# Patient Record
Sex: Female | Born: 1985 | Race: White | Hispanic: No | Marital: Single | Smoking: Never smoker
Health system: Southern US, Academic
[De-identification: ages and names within clinical notes are randomized; demographics above are authoritative.]

## PROBLEM LIST (undated history)

## (undated) DIAGNOSIS — N809 Endometriosis, unspecified: Secondary | ICD-10-CM

## (undated) DIAGNOSIS — M8580 Other specified disorders of bone density and structure, unspecified site: Secondary | ICD-10-CM

## (undated) DIAGNOSIS — J302 Other seasonal allergic rhinitis: Secondary | ICD-10-CM

## (undated) DIAGNOSIS — J309 Allergic rhinitis, unspecified: Secondary | ICD-10-CM

## (undated) DIAGNOSIS — F0781 Postconcussional syndrome: Secondary | ICD-10-CM

## (undated) HISTORY — DX: Postconcussional syndrome: F07.81

## (undated) HISTORY — DX: Allergic rhinitis, unspecified: J30.9

## (undated) HISTORY — PX: HX CYST REMOVAL: SHX22

---
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
## 2005-05-02 ENCOUNTER — Ambulatory Visit (INDEPENDENT_AMBULATORY_CARE_PROVIDER_SITE_OTHER): Payer: Self-pay

## 2005-05-03 ENCOUNTER — Ambulatory Visit (INDEPENDENT_AMBULATORY_CARE_PROVIDER_SITE_OTHER): Payer: Self-pay | Admitting: Multi-Specialty

## 2005-05-23 ENCOUNTER — Ambulatory Visit (INDEPENDENT_AMBULATORY_CARE_PROVIDER_SITE_OTHER): Payer: Self-pay

## 2005-08-05 ENCOUNTER — Emergency Department (HOSPITAL_COMMUNITY): Payer: Self-pay | Admitting: EMERGENCY MEDICINE

## 2006-03-12 ENCOUNTER — Emergency Department (HOSPITAL_COMMUNITY): Payer: Auto Insurance (includes no fault)

## 2006-03-12 ENCOUNTER — Emergency Department (EMERGENCY_DEPARTMENT_HOSPITAL): Payer: Auto Insurance (includes no fault) | Admitting: Radiology

## 2006-03-12 ENCOUNTER — Emergency Department
Admission: EM | Admit: 2006-03-12 | Discharge: 2006-03-12 | Disposition: A | Discharge status: Home / Self Care | Payer: Auto Insurance (includes no fault) | Source: Emergency Department | Attending: Emergency Medicine | Admitting: Emergency Medicine

## 2006-03-12 ENCOUNTER — Emergency Department (EMERGENCY_DEPARTMENT_HOSPITAL): Payer: Auto Insurance (includes no fault)

## 2006-03-12 ENCOUNTER — Encounter (EMERGENCY_DEPARTMENT_HOSPITAL): Payer: Self-pay

## 2006-03-12 DIAGNOSIS — M25569 Pain in unspecified knee: Secondary | ICD-10-CM | POA: Insufficient documentation

## 2006-03-12 DIAGNOSIS — IMO0002 Reserved for concepts with insufficient information to code with codable children: Secondary | ICD-10-CM | POA: Insufficient documentation

## 2006-03-12 DIAGNOSIS — S139XXA Sprain of joints and ligaments of unspecified parts of neck, initial encounter: Secondary | ICD-10-CM | POA: Insufficient documentation

## 2006-03-12 DIAGNOSIS — S060X1A Concussion with loss of consciousness of 30 minutes or less, initial encounter: Secondary | ICD-10-CM | POA: Insufficient documentation
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
## 2006-08-07 ENCOUNTER — Ambulatory Visit (INDEPENDENT_AMBULATORY_CARE_PROVIDER_SITE_OTHER): Payer: No Typology Code available for payment source

## 2006-08-27 ENCOUNTER — Ambulatory Visit (INDEPENDENT_AMBULATORY_CARE_PROVIDER_SITE_OTHER): Payer: No Typology Code available for payment source | Admitting: Specialist

## 2006-09-13 ENCOUNTER — Ambulatory Visit (INDEPENDENT_AMBULATORY_CARE_PROVIDER_SITE_OTHER): Payer: Auto Insurance (includes no fault) | Admitting: Specialist

## 2006-11-13 ENCOUNTER — Encounter (INDEPENDENT_AMBULATORY_CARE_PROVIDER_SITE_OTHER): Payer: Auto Insurance (includes no fault) | Admitting: Specialist

## 2006-12-11 ENCOUNTER — Encounter (INDEPENDENT_AMBULATORY_CARE_PROVIDER_SITE_OTHER): Payer: Auto Insurance (includes no fault) | Admitting: Reproductive Endocrinology

## 2007-01-24 ENCOUNTER — Encounter (INDEPENDENT_AMBULATORY_CARE_PROVIDER_SITE_OTHER): Payer: No Typology Code available for payment source | Admitting: Reproductive Endocrinology

## 2007-02-16 ENCOUNTER — Ambulatory Visit (INDEPENDENT_AMBULATORY_CARE_PROVIDER_SITE_OTHER): Payer: No Typology Code available for payment source | Admitting: Reproductive Endocrinology
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
## 2008-02-06 ENCOUNTER — Ambulatory Visit (INDEPENDENT_AMBULATORY_CARE_PROVIDER_SITE_OTHER): Payer: Self-pay | Admitting: Specialist

## 2008-02-06 MED ORDER — RIZATRIPTAN 10 MG DISINTEGRATING TABLET
10.00 mg | ORAL_TABLET | Freq: Every day | ORAL | Status: DC | PRN
Start: 2008-02-06 — End: 2008-12-18

## 2008-02-06 MED ORDER — TOPIRAMATE 25 MG TABLET
25.00 mg | ORAL_TABLET | ORAL | Status: DC
Start: 2008-02-06 — End: 2008-12-18

## 2008-02-06 MED ORDER — TOPIRAMATE 100 MG TABLET
100.00 mg | ORAL_TABLET | Freq: Every evening | ORAL | Status: DC
Start: 2008-02-06 — End: 2008-10-01

## 2008-06-18 ENCOUNTER — Ambulatory Visit (INDEPENDENT_AMBULATORY_CARE_PROVIDER_SITE_OTHER): Payer: Self-pay | Admitting: Specialist

## 2008-06-18 MED ORDER — PROPRANOLOL 20 MG TABLET
ORAL_TABLET | ORAL | Status: DC
Start: 2008-06-18 — End: 2008-10-01

## 2008-10-01 ENCOUNTER — Other Ambulatory Visit (INDEPENDENT_AMBULATORY_CARE_PROVIDER_SITE_OTHER): Payer: Self-pay | Admitting: Specialist

## 2008-10-01 MED ORDER — TOPIRAMATE 100 MG TABLET
100.00 mg | ORAL_TABLET | Freq: Every evening | ORAL | Status: DC
Start: 2008-10-01 — End: 2008-12-18

## 2008-10-01 MED ORDER — PROPRANOLOL 20 MG TABLET
ORAL_TABLET | ORAL | Status: DC
Start: 2008-10-01 — End: 2008-12-18

## 2008-10-01 NOTE — Telephone Encounter (Signed)
Called in  done

## 2008-12-18 ENCOUNTER — Ambulatory Visit (INDEPENDENT_AMBULATORY_CARE_PROVIDER_SITE_OTHER): Admitting: Specialist

## 2008-12-18 MED ORDER — TOPIRAMATE 100 MG TABLET
ORAL_TABLET | ORAL | Status: DC
Start: 2008-12-18 — End: 2010-01-11

## 2008-12-18 MED ORDER — MAGNESIUM OXIDE 400 MG (241.3 MG MAGNESIUM) TABLET
400.00 mg | ORAL_TABLET | Freq: Two times a day (BID) | ORAL | Status: DC
Start: 2008-12-18 — End: 2009-06-21

## 2008-12-18 MED ORDER — ELETRIPTAN 40 MG TABLET
ORAL_TABLET | ORAL | Status: DC
Start: 2008-12-18 — End: 2009-12-20

## 2008-12-18 NOTE — Progress Notes (Signed)
RE:  Alison Harvey          1985/05/20    Dear Noralee Space, PA-C:    I had the pleasure of seeing Alison Harvey in follow-up at the [facility] on 12/18/2008.  Today she reports that her headaches have not changed.  She is currently taking propanolol (only once per day due to hypotension) and topiramate for prevention and using rizatriptan as needed for her headaches.  Severe Headaches: 5 per month  Total Headaches: 7 per week at 2-3/10 in severity  Headache Free Days: 0 per week  Headache Description: throbbing pain, dull pain, bilateral in the frontal area.  Rated as 8-9/10.  Response to abortive medications: Fair - maxalt seems to be less effective, needs 2 pills, inconsistent effect  Side effects to medications: hypotension with maxalt    Results of previous testing: n/a    Current outpatient prescriptions   Medication Sig Dispense Refill   . propranolol (INDERAL) 20 mg Tab take by mouth. 1 qday       . oral contraceptive (PATIENT'S OWN SUPPLY) take by mouth.        . topiramate (TOPAMAX) 100 mg Tab take by mouth. 1 bid   60  11   . eletriptan (RELPAX) 40 mg Tab take by mouth. Take at onset of headache, may repeat in 2 hours if needed.  Max 2/d, max 3d/wk   9  11   . magnesium oxide (MAG-OX) 400 mg Tab take 1 Tab by mouth Twice daily.   0  0   . DISCONTD: propranolol (INDERAL) 20 mg Tab take by mouth. 1 qam x 7d, then 1 bid   180  3   . DISCONTD: topiramate (TOPAMAX) 100 mg Tab take 1 Tab by mouth every night.   90  3   . DISCONTD: topiramate (TOPAMAX) 25 mg Tab take 1 Tab by mouth See Comment. 1 qhs x 7d, 2 qhs x 7d, 3 qhs x 7d   42  0   . DISCONTD: Rizatriptan (MAXALT-MLT) 10 mg TbDL take 1 Tab by mouth Once per day as needed for Migraine. may repeat in 2 hours if needed. Maximum 2/d, maximum 3d/wk   12  11          Past medical history, family history, and social history have been reviewed and confirmed.    Sleep:on night shift, sleep poor  Mood:Euthymic    Filed Vitals:    12/18/2008 11:20 AM    BP: 110/78   Pulse: 72   Height: 1.626 m (5\' 4" )   Weight: 49.442 kg (109 lb)       Her exam reveals she is normocephalic and atraumatic.  Mental status exam shows her to be oriented with good memory, attention, knowledge, and language.  Cranial nerve exam reveals equal and reactive pupils with intact conjugate eye movements, symmetric face, and clear speech.  Gait is steady and coordination is intact to finger-to-nose testing.    Encounter Diagnoses   Code Name Primary? Qualifier   . 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 784.0EX Chronic daily headache      Plan: TOPIRAMATE 100 MG TAB, MAGNESIUM OXIDE 400 MG TAB    She is going to increase topamax to 100mg  bid and add magnesium for prevention.  Due to side effects with imitrex and lack of response to  maxalt, she is going to go onto relpax as needed.    The patient will follow up with me in 6 months or sooner if needed.    Oley Balm Claudette Laws MD  Director, [facility]  Assistant Professor of Neurology  [facility] [employer]

## 2009-04-26 ENCOUNTER — Telehealth (INDEPENDENT_AMBULATORY_CARE_PROVIDER_SITE_OTHER): Payer: Self-pay | Admitting: Specialist

## 2009-04-26 NOTE — Telephone Encounter (Signed)
Worse headaches may be effect of poor sleep.  Take melatonin - over the counter - 3mg  up to 6mg  1 hour before bed.

## 2009-04-26 NOTE — Telephone Encounter (Signed)
Dr. Claudette Laws pt//////////////pt states that she can not sleep well and wants to know if she can take something to sleep.pt is still having headaches and they are worse than they were. Please call and advose. Thank you

## 2009-04-27 ENCOUNTER — Encounter (INDEPENDENT_AMBULATORY_CARE_PROVIDER_SITE_OTHER): Payer: Self-pay | Admitting: Specialist

## 2009-04-27 NOTE — Progress Notes (Signed)
Left mess on voice mail of Dr Aura Dials response

## 2009-06-21 ENCOUNTER — Encounter (INDEPENDENT_AMBULATORY_CARE_PROVIDER_SITE_OTHER): Payer: Self-pay | Admitting: Specialist

## 2009-06-21 ENCOUNTER — Ambulatory Visit (INDEPENDENT_AMBULATORY_CARE_PROVIDER_SITE_OTHER): Payer: PPO | Admitting: Specialist

## 2009-06-21 MED ORDER — PROPRANOLOL 20 MG TABLET
ORAL_TABLET | ORAL | Status: DC
Start: 2009-06-21 — End: 2009-12-20

## 2009-06-21 MED ORDER — MAGNESIUM OXIDE 400 MG (241.3 MG MAGNESIUM) TABLET
ORAL_TABLET | ORAL | Status: DC
Start: 2009-06-21 — End: 2009-12-20

## 2009-06-21 NOTE — Progress Notes (Signed)
RE:  Alison Harvey          Jan 04, 1986    Dear Noralee Space, PA-C:    I had the pleasure of seeing Alison Harvey in follow-up at the [facility] on 06/21/2009.  Today she reports that her headaches have not changed.  She is currently taking topiramate for prevention and using rizatriptan as needed for her headaches.  Severe Headaches: 6-8 per month  Total Headaches: 7 per week  Headache Free Days: 0 per week  Headache Description: throbbing pain, dull pain, bilateral in the frontal area.  Rated as 2/10 at baseline and up to 9/10 for severe headaches.  It can last hours to all day.  Response to abortive medications: Fair - reduces severe headaches to a tolerable level.  Side effects to medications: none    Results of previous testing: n/a    Current outpatient prescriptions   Medication Sig Dispense Refill   . propranolol (INDERAL) 20 mg Tab take  by mouth. 1 daily  30 Tab  11   . magnesium oxide (MAG-OX) 400 mg Tab take  by mouth. bid  60 Tab  0   . oral contraceptive (PATIENT'S OWN SUPPLY) take by mouth.        . topiramate (TOPAMAX) 100 mg Tab take by mouth. 1 bid   60  11   . eletriptan (RELPAX) 40 mg Tab take by mouth. Take at onset of headache, may repeat in 2 hours if needed.  Max 2/d, max 3d/wk   9  11   . DISCONTD: propranolol (INDERAL) 20 mg Tab take by mouth. 1 qday       . DISCONTD: magnesium oxide (MAG-OX) 400 mg Tab take 1 Tab by mouth Twice daily.   0  0        Past medical history, family history, and social history have been reviewed and confirmed.    Sleep:ok  Mood:Euthymic    Filed Vitals:    06/21/09 1500   BP: 110/72   Pulse: 72   Height: 1.626 m (5\' 4" )   Weight: 48.081 kg (106 lb)        Her exam reveals she is normocephalic and atraumatic.  Mental status exam shows her to be oriented with good memory, attention, knowledge, and language.  Cranial nerve exam reveals equal and reactive pupils with intact conjugate eye movements, symmetric face, and clear speech, and normal hearing to voice.  Gait is steady and coordination is intact to finger-to-nose testing.  All four extremeties have normal strength.    1. Chronic daily headache (784.0EX)  propranolol (INDERAL) 20 mg Tab, magnesium oxide (MAG-OX) 400 mg Tab   2. 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  We will add the propranolol back and go ahead and start magnesium.  She will continue on topamax.    The patient will follow up with me in 6 months or sooner if needed.      Oley Balm Claudette Laws MD  Director, [facility]  Assistant Professor of Neurology  [facility]

## 2009-07-01 ENCOUNTER — Ambulatory Visit (INDEPENDENT_AMBULATORY_CARE_PROVIDER_SITE_OTHER): Payer: Self-pay | Admitting: Specialist

## 2009-07-01 MED ORDER — RIZATRIPTAN 10 MG DISINTEGRATING TABLET
10.0000 mg | ORAL_TABLET | Freq: Every day | ORAL | Status: DC | PRN
Start: 2009-07-01 — End: 2009-08-25

## 2009-07-01 NOTE — Telephone Encounter (Signed)
Message copied by Lamont Dowdy on Thu Jul 01, 2009 10:41 AM  ------       Message from: Stark Bray       Created: Thu Jul 01, 2009 10:40 AM         >> DONNA HEGEDIS 07/01/2009 10:40 AM       Dr. Claudette Laws patient said that when she left last time she forgot to get a refill for her maxalt and now she is out of it. Please call in to [facility] [address] [place] [facility] [place] [id number]              Phone: [phone] Fax: [phone]

## 2009-07-01 NOTE — Telephone Encounter (Signed)
It's ok.  Done.

## 2009-07-01 NOTE — Telephone Encounter (Signed)
maxalt not on list of meds  ?refill?

## 2009-08-25 ENCOUNTER — Other Ambulatory Visit (INDEPENDENT_AMBULATORY_CARE_PROVIDER_SITE_OTHER): Payer: Self-pay | Admitting: Specialist

## 2009-08-26 MED ORDER — RIZATRIPTAN 10 MG DISINTEGRATING TABLET
10.0000 mg | ORAL_TABLET | Freq: Every day | ORAL | Status: DC | PRN
Start: 2009-08-25 — End: 2009-12-20

## 2009-11-30 ENCOUNTER — Ambulatory Visit (INDEPENDENT_AMBULATORY_CARE_PROVIDER_SITE_OTHER): Payer: Self-pay | Admitting: Specialist

## 2009-11-30 NOTE — Telephone Encounter (Signed)
Please advise. Thank you

## 2009-11-30 NOTE — Telephone Encounter (Signed)
Message copied by Elie Confer on Tue Nov 30, 2009  3:29 PM  ------       Message from: Lamont Dowdy       Created: Tue Nov 30, 2009 10:48 AM         >> Alison Harvey 11/30/2009 10:47 AM       Dr. Claudette Laws pt/////////pt states she is having severe headaches for the past three days and nothing seems to be helping. Please call and advise. Thank you

## 2009-12-01 MED ORDER — DEXAMETHASONE 4 MG TABLET
ORAL_TABLET | ORAL | Status: DC
Start: 2009-12-01 — End: 2009-12-01

## 2009-12-01 MED ORDER — DEXAMETHASONE 4 MG TABLET
ORAL_TABLET | ORAL | Status: DC
Start: 2009-12-01 — End: 2009-12-20

## 2009-12-01 NOTE — Telephone Encounter (Signed)
Left v.m.on pt's identified cell phone with provider's info. Pt can pick up at [facility].

## 2009-12-01 NOTE — Telephone Encounter (Signed)
Called pt with decadron info and she states she is allergic to prednisone....please advise. Thank you.  Pharmacy changed as well per pt's request. [facility] at [facility] in lieu of [facility]. It is in computer.

## 2009-12-01 NOTE — Telephone Encounter (Signed)
It should be ok - they are different types of steroids.

## 2009-12-01 NOTE — Telephone Encounter (Signed)
decadron

## 2009-12-20 ENCOUNTER — Ambulatory Visit (INDEPENDENT_AMBULATORY_CARE_PROVIDER_SITE_OTHER): Payer: 59 | Admitting: Specialist

## 2009-12-20 ENCOUNTER — Encounter (INDEPENDENT_AMBULATORY_CARE_PROVIDER_SITE_OTHER): Payer: Self-pay | Admitting: Specialist

## 2009-12-20 MED ORDER — RIZATRIPTAN 10 MG DISINTEGRATING TABLET
10.0000 mg | ORAL_TABLET | Freq: Every day | ORAL | Status: AC | PRN
Start: 2009-12-20 — End: ?

## 2009-12-20 MED ORDER — PROPRANOLOL 10 MG TABLET
ORAL_TABLET | ORAL | Status: DC
Start: 2009-12-20 — End: 2010-12-19

## 2009-12-20 MED ORDER — ELETRIPTAN 40 MG TABLET
ORAL_TABLET | ORAL | Status: DC
Start: 2009-12-20 — End: 2010-06-23

## 2009-12-20 NOTE — Progress Notes (Signed)
RE:  Keilly Fatula          December 14, 1985    Dear Noralee Space, PA-C:    I had the pleasure of seeing Alison Harvey in follow-up at the [facility] on 12/20/2009.  Today she reports that her headaches had improved but got worse after she went to [place] in October.  She is currently taking propanolol and topiramate for prevention and using rizatriptan and eletriptan as needed for her headaches. She did find that decadron was tolerable for her and it helped to break an intractible severe headache.  Severe Headaches: 2 per week (this had been twice per month)  Total Headaches: 7 per week very mild  Headache Free Days: 0 per week  Headache Description: throbbing pain, bilateral in the frontal area, but a recent headache seemed to be spread to temporal regions bilaterally.    Response to abortive medications: Fair - she prefers maxalt but it seems to lose effectiveness after a while and she has to use a different triptan for a few headaches.  Side effects to medications: none    Results of previous testing: n/a    Current outpatient prescriptions   Medication Sig Dispense Refill   . propranolol (INDERAL) 10 mg Oral Tablet take  by mouth. 2 qam and 1 qhs  90 Tab  11   . Rizatriptan (MAXALT-MLT) 10 mg Oral Tablet, Rapid Dissolve take 1 Tab by mouth Once per day as needed for Migraine (may repeat in 2 hours if needed.). Maximum 2/d, maximum 3d/wk  12 Tab  11   . eletriptan (RELPAX) 40 mg Oral Tablet take  by mouth. Take at onset of headache, may repeat in 2 hours if needed.  Max 2/d, max 3d/wk  9 Tab  11   . oral contraceptive (PATIENT'S OWN SUPPLY) take by mouth.        . topiramate (TOPAMAX) 100 mg Tab take by mouth. 1 bid   60  11   . DISCONTD: dexamethasone (DECADRON) 4 mg Oral Tablet take  by mouth. 3/d x 1d, 2/d x 1d, 1/d x 1d  6 Tab  0    . DISCONTD: Rizatriptan (MAXALT-MLT) 10 mg TbDL take 1 Tab by mouth Once per day as needed for Migraine (may repeat in 2 hours if needed.). Maximum 2/d, maximum 3d/wk  12 Tab  11   . DISCONTD: propranolol (INDERAL) 20 mg Tab take  by mouth. 1 daily  30 Tab  11   . DISCONTD: magnesium oxide (MAG-OX) 400 mg Tab take  by mouth. bid  60 Tab  0   . DISCONTD: eletriptan (RELPAX) 40 mg Tab take by mouth. Take at onset of headache, may repeat in 2 hours if needed.  Max 2/d, max 3d/wk   9  11        Past medical history, family history, and social history have been reviewed and confirmed.    Sleep:ok  Mood:Euthymic    Filed Vitals:    12/20/09 1348   BP: 104/66   Pulse: 72   Height: 1.626 m (5\' 4" )   Weight: 49.941 kg (110 lb 1.6 oz)       Her exam reveals she is normocephalic and atraumatic.  Mental status exam shows her to be oriented with good memory, attention, knowledge, and language.  Cranial nerve exam reveals equal and reactive pupils with intact conjugate eye movements, symmetric face, and clear speech, and normal hearing to voice.  Gait is steady and coordination is intact  to finger-to-nose testing.  All four extremeties have normal strength.    1. Chronic daily headache (784.0EX)  propranolol (INDERAL) 10 mg Oral Tablet   2. 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    The patient will follow up with me in 4-6 months or sooner if needed.    Oley Balm Claudette Laws MD  Director, [facility]  Assistant Professor of Neurology  [facility]

## 2010-01-11 ENCOUNTER — Other Ambulatory Visit (INDEPENDENT_AMBULATORY_CARE_PROVIDER_SITE_OTHER): Payer: Self-pay | Admitting: Specialist

## 2010-01-11 MED ORDER — TOPIRAMATE 100 MG TABLET
ORAL_TABLET | ORAL | Status: DC
Start: 2010-01-11 — End: 2010-12-19

## 2010-01-11 NOTE — Telephone Encounter (Signed)
Please erx

## 2010-01-11 NOTE — Telephone Encounter (Signed)
Message copied by Lamont Dowdy on Tue Jan 11, 2010 10:55 AM  ------       Message from: GAMBLE, STEPHANIE       Created: Tue Jan 11, 2010 10:37 AM         >> STEPHANIE GAMBLE 01/11/2010 10:37 AM        DR Claudette Laws                          PATIENT NEEDS A REFILL ON HER topiramate (TOPAMAX) 100 mg Tab       [facility] [address], [place] - [id number]              Phone: [phone] Fax: [phone]

## 2010-03-03 ENCOUNTER — Ambulatory Visit (INDEPENDENT_AMBULATORY_CARE_PROVIDER_SITE_OTHER): Payer: Self-pay | Admitting: Specialist

## 2010-03-03 NOTE — Telephone Encounter (Signed)
Message copied by Kalman Shan on Thu Mar 03, 2010  3:19 PM  ------       Message from: Georgina Peer       Created: Thu Mar 03, 2010  2:57 PM         >> Georgina Peer 03/03/2010 02:57 PM       Dr. Claudette Laws, patient has had a migraine for about 7 days now and none of her medication are working.  May be reached at the above #.  Thank you

## 2010-03-03 NOTE — Telephone Encounter (Signed)
Please advise 

## 2010-03-04 MED ORDER — DEXAMETHASONE 4 MG TABLET
ORAL_TABLET | ORAL | Status: DC
Start: 2010-03-04 — End: 2010-06-23

## 2010-03-04 NOTE — Telephone Encounter (Signed)
decadron

## 2010-03-04 NOTE — Telephone Encounter (Signed)
Informed pt of this message
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
## 2010-06-23 ENCOUNTER — Ambulatory Visit
Admission: RE | Admit: 2010-06-23 | Discharge: 2010-06-23 | Disposition: A | Discharge status: Home / Self Care | Payer: BC Managed Care – PPO | Source: Ambulatory Visit | Attending: Specialist | Admitting: Specialist

## 2010-06-23 ENCOUNTER — Ambulatory Visit (INDEPENDENT_AMBULATORY_CARE_PROVIDER_SITE_OTHER): Payer: BC Managed Care – PPO | Admitting: Specialist

## 2010-06-23 ENCOUNTER — Encounter (INDEPENDENT_AMBULATORY_CARE_PROVIDER_SITE_OTHER): Payer: Self-pay | Admitting: Specialist

## 2010-06-23 DIAGNOSIS — M2550 Pain in unspecified joint: Secondary | ICD-10-CM | POA: Insufficient documentation

## 2010-06-23 LAB — SEDIMENTATION RATE: SEDIMENTATION RATE: 2 mm/hr (ref 0–20)

## 2010-06-23 MED ORDER — ELETRIPTAN 40 MG TABLET
ORAL_TABLET | ORAL | Status: DC
Start: 2010-06-23 — End: 2010-12-19

## 2010-06-23 MED ORDER — NORTRIPTYLINE 25 MG CAPSULE
ORAL_CAPSULE | ORAL | Status: AC
Start: 2010-06-23 — End: ?

## 2010-06-23 NOTE — Progress Notes (Signed)
RE:  Alison Harvey          February 20, 1985    I had the pleasure of seeing Alison Harvey in follow-up at the [facility] on 06/23/2010.  Today she reports that her headaches have improved.  She is currently taking propanolol and topiramate for prevention and using rizatriptan and eletriptan as needed for her headaches.  Decadron has been needed a couple times to break a cycle.  Severe Headaches: 2 per month but also has had a couple week long bad headaches  Total Headaches: 7 per week - 3/10  Headache Free Days: 0 per week  Headache Description: throbbing pain, bilateral in the frontal area.   Response to abortive medications: Fair  Side effects to medications: none    Results of previous testing: n/a    Current Outpatient Prescriptions   Medication Sig Dispense Refill   . multivitamin Oral Tablet take 1 Tab by mouth Once a day.         . Ascorbic Acid (VITAMIN C) 1,000 mg Oral Tablet take 1,000 mg by mouth Once a day.         . B complex vitamins (VITAMIN B COMPLEX) Oral Capsule take 1 Cap by mouth Once a day.         Marland Kitchen CALCIUM CARBONATE/VITAMIN D3 (CALCIUM 500 WITH D ORAL) take 1 Tab by mouth Once a day.         . nortriptyline (PAMELOR) 25 mg Oral Capsule 1 qhs x 7d, then 2 qhs  60 Cap  5   . eletriptan (RELPAX) 40 mg Oral Tablet Take at onset of headache, may repeat in 2 hours if needed.  Max 2/d, max 3d/wk  9 Tab  11   . topiramate (TOPAMAX) 100 mg Oral Tablet take  by mouth. 1 bid  60 Tab  11   . propranolol (INDERAL) 10 mg Oral Tablet take  by mouth. 2 qam and 1 qhs  90 Tab  11   . Rizatriptan (MAXALT-MLT) 10 mg Oral Tablet, Rapid Dissolve take 1 Tab by mouth Once per day as needed for Migraine (may repeat in 2 hours if needed.). Maximum 2/d, maximum 3d/wk  12 Tab  11   . oral contraceptive (PATIENT'S OWN SUPPLY) take by mouth.        . DISCONTD: dexamethasone (DECADRON) 4 mg Oral Tablet take  by mouth. 3/d x 1d, 2/d x 1d, 1/d x 1d  6 Tab  0    . DISCONTD: eletriptan (RELPAX) 40 mg Oral Tablet take  by mouth. Take at onset of headache, may repeat in 2 hours if needed.  Max 2/d, max 3d/wk  9 Tab  11        Past medical history, family history, and social history have been reviewed and confirmed.    Sleep:trouble staying asleep, she is having joint and leg pain, she has swelling in her ankles and her fingers  Mood:Euthymic    Filed Vitals:    06/23/10 1102   BP: 110/70   Pulse: 78   Height: 1.638 m (5' 4.5")   Weight: 57.289 kg (126 lb 4.8 oz)     Her exam reveals she is normocephalic and atraumatic.  Mental status exam shows her to be oriented with good memory, attention, knowledge, and language.  Cranial nerve exam reveals equal and reactive pupils with intact conjugate eye movements, symmetric face, and clear speech, and normal hearing to voice.  Gait is steady and coordination is intact to  finger-to-nose testing.  All four extremeties have normal strength.    1. Chronic daily headache  nortriptyline (PAMELOR) 25 mg Oral Capsule   2. Mgrn wo aura wo intrc mgr  nortriptyline (PAMELOR) 25 mg Oral Capsule, eletriptan (RELPAX) 40 mg Oral Tablet   3. Joint pain  SEDIMENTATION RATE, ANA    She is having various joint pains and we will check esr and ana.  If positive, will send to Rheumatology.  She will try pamelor 10mg  and titrate up if tolerated.  She will taper off of propranolol.    The patient will follow up with me in 4-6 months or sooner if needed.    Malena Edman, MD 06/23/2010, 11:27 AM    Oley Balm. Claudette Laws MD  Director, [facility]  Assistant Professor of Neurology  [facility]

## 2010-06-24 LAB — HEP-2 SUBSTRATE ANTINUCLEAR ANTIBODIES (ANA), SERUM: ANTI-NUCLEAR AB: NEGATIVE

## 2010-06-27 ENCOUNTER — Encounter (INDEPENDENT_AMBULATORY_CARE_PROVIDER_SITE_OTHER): Payer: Self-pay

## 2010-06-27 NOTE — Progress Notes (Signed)
Called [phone] - transferred to [phone]  [phone]  For prior auth - on relpax 40mg   They will fax [place] a form

## 2010-07-12 ENCOUNTER — Encounter (INDEPENDENT_AMBULATORY_CARE_PROVIDER_SITE_OTHER): Payer: Self-pay | Admitting: Specialist

## 2010-07-14 NOTE — Telephone Encounter (Signed)
Please advise  I changed allergy

## 2010-07-14 NOTE — Telephone Encounter (Signed)
Message copied by Lamont Dowdy on Thu Jul 14, 2010  7:53 AM  ------       Message from: Luanne Bras       Created: Tue Jul 12, 2010  5:37 PM       Regarding: Non-Urgent Medical Question       Contact: [phone]         Allergy Correction: I am allergic to Bactrim NOT biaxin.              MD Message: Attempted to wean propanolol. Only able to wean to 10mg  daily without experiencing palpitations as before. Continue to take 10mg  daily?

## 2010-07-18 ENCOUNTER — Encounter (INDEPENDENT_AMBULATORY_CARE_PROVIDER_SITE_OTHER): Payer: Self-pay | Admitting: Specialist

## 2010-07-19 NOTE — Telephone Encounter (Signed)
Message copied by Lamont Dowdy on Tue Jul 19, 2010  8:26 AM  ------       Message from: Luanne Bras       Created: Mon Jul 18, 2010  7:25 PM       Regarding: Visit Follow-Up Question       Contact: [phone]         Have had 2 severe migraines since changing meds. Daily HA worse. 7/10. Pamelor not helping with sleep but increasing dose causing fog, "zombie-like." I think former meds worked better. Switch back?       Also will be scheduling appointment with Dr. Amie Portland as PCP.

## 2010-07-19 NOTE — Telephone Encounter (Signed)
Please advise 

## 2010-07-21 NOTE — Telephone Encounter (Signed)
Called pt and informed

## 2010-07-21 NOTE — Telephone Encounter (Signed)
Message copied by Malena Edman on Thu Jul 21, 2010  8:19 AM  ------       Message from: Lamont Dowdy       Created: Tue Jul 19, 2010  8:26 AM       Regarding: Visit Follow-Up Question                 ----- Message from Benjamine Mola to Towanda Malkin, MD sent at 07/18/2010  7:25 PM -----        Have had 2 severe migraines since changing meds. Daily HA worse. 7/10. Pamelor not helping with sleep but increasing dose causing fog, "zombie-like." I think former meds worked better. Switch back?       Also will be scheduling appointment with Dr. Amie Portland as PCP.

## 2010-07-21 NOTE — Telephone Encounter (Signed)
Message copied by Lamont Dowdy on Thu Jul 21, 2010  8:48 AM  ------       Message from: Claudette Laws, DAVID B       Created: Thu Jul 21, 2010  8:20 AM       Regarding: Visit Follow-Up Question                 ----- Message from Benjamine Mola to Towanda Malkin, MD sent at 07/18/2010  7:25 PM -----        Have had 2 severe migraines since changing meds. Daily HA worse. 7/10. Pamelor not helping with sleep but increasing dose causing fog, "zombie-like." I think former meds worked better. Switch back?       Also will be scheduling appointment with Dr. Amie Portland as PCP.

## 2010-07-21 NOTE — Telephone Encounter (Signed)
 Message copied by TRELLIS RUNG on Thu Jul 21, 2010 10:28 AM  ------       Message from: TRELLIS RUNG       Created: Thu Jul 21, 2010  8:48 AM       Regarding: Visit Follow-Up Question                 ----- Message from Alison Harvey to Alison Lenis, MD sent at 07/18/2010  7:25 PM -----        Have had 2 severe migraines since changing meds. Daily HA worse. 7/10. Pamelor  not helping with sleep but increasing dose causing fog, zombie-like. I think former meds worked better. Switch back?       Also will be scheduling appointment with Dr. Orvil Mako as PCP.

## 2010-07-21 NOTE — Telephone Encounter (Signed)
Have her go back up a little on the propranolol.

## 2010-08-22 ENCOUNTER — Ambulatory Visit (INDEPENDENT_AMBULATORY_CARE_PROVIDER_SITE_OTHER): Payer: Self-pay | Admitting: Neurological Surgery

## 2010-08-31 ENCOUNTER — Ambulatory Visit (INDEPENDENT_AMBULATORY_CARE_PROVIDER_SITE_OTHER): Payer: Self-pay | Admitting: Neurological Surgery

## 2010-12-19 ENCOUNTER — Other Ambulatory Visit (INDEPENDENT_AMBULATORY_CARE_PROVIDER_SITE_OTHER): Payer: Self-pay | Admitting: Specialist

## 2010-12-19 MED ORDER — TOPIRAMATE 100 MG TABLET
ORAL_TABLET | ORAL | Status: AC
Start: 2010-12-19 — End: ?

## 2010-12-19 MED ORDER — PROPRANOLOL 10 MG TABLET
ORAL_TABLET | ORAL | Status: AC
Start: 2010-12-19 — End: ?

## 2010-12-19 MED ORDER — ELETRIPTAN 40 MG TABLET
ORAL_TABLET | ORAL | Status: AC
Start: 2010-12-19 — End: ?

## 2010-12-19 NOTE — Telephone Encounter (Signed)
Message copied by Lamont Dowdy on Mon Dec 19, 2010 11:33 AM  ------       Message from: GAMBLE, STEPHANIE       Created: Mon Dec 19, 2010 11:05 AM         >> STEPHANIE GAMBLE 12/19/2010 11:05 AM       DR Claudette Laws                       PATIENT NEEDS A REFILL ON HER topiramate (TOPAMAX) 100 mg Oral Tablet AND propranolol (INDERAL) 10 mg Oral Tablet AND eletriptan (RELPAX) 40 mg Oral Tablet       Preferred Pharmacy          [facility] [address], [place] - [address]         [address] [place] [address]         Phone: [phone] Fax: [phone]

## 2010-12-19 NOTE — Telephone Encounter (Signed)
Please erx

## 2010-12-26 ENCOUNTER — Encounter (HOSPITAL_BASED_OUTPATIENT_CLINIC_OR_DEPARTMENT_OTHER): Payer: Self-pay | Admitting: Specialist

## 2011-04-24 ENCOUNTER — Encounter (HOSPITAL_BASED_OUTPATIENT_CLINIC_OR_DEPARTMENT_OTHER): Payer: Self-pay | Admitting: Specialist

## 2011-04-24 ENCOUNTER — Ambulatory Visit (HOSPITAL_BASED_OUTPATIENT_CLINIC_OR_DEPARTMENT_OTHER): Payer: Self-pay | Admitting: Specialist

## 2011-05-19 ENCOUNTER — Ambulatory Visit (INDEPENDENT_AMBULATORY_CARE_PROVIDER_SITE_OTHER): Payer: Self-pay | Admitting: Specialist

## 2012-11-04 ENCOUNTER — Other Ambulatory Visit: Payer: Self-pay

## 2013-10-12 ENCOUNTER — Other Ambulatory Visit: Payer: Self-pay

## 2013-12-14 ENCOUNTER — Ambulatory Visit (HOSPITAL_COMMUNITY): Payer: Self-pay | Admitting: Physical Medicine & Rehabilitation

## 2014-04-21 ENCOUNTER — Other Ambulatory Visit (HOSPITAL_COMMUNITY): Payer: Self-pay | Admitting: OBSTETRICS/GYNECOLOGY

## 2014-04-21 DIAGNOSIS — Z363 Encounter for antenatal screening for malformations: Secondary | ICD-10-CM

## 2014-05-15 ENCOUNTER — Ambulatory Visit
Admission: RE | Admit: 2014-05-15 | Discharge: 2014-05-15 | Disposition: A | Discharge status: Home / Self Care | Payer: BC Managed Care – PPO | Source: Ambulatory Visit | Attending: OBSTETRICS/GYNECOLOGY | Admitting: OBSTETRICS/GYNECOLOGY

## 2014-05-15 DIAGNOSIS — Z363 Encounter for antenatal screening for malformations: Secondary | ICD-10-CM

## 2014-05-15 DIAGNOSIS — Z36 Encounter for antenatal screening of mother: Secondary | ICD-10-CM | POA: Insufficient documentation

## 2014-11-07 ENCOUNTER — Other Ambulatory Visit: Payer: Self-pay

## 2015-07-07 ENCOUNTER — Encounter (INDEPENDENT_AMBULATORY_CARE_PROVIDER_SITE_OTHER): Payer: Self-pay | Admitting: Otolaryngology

## 2015-07-07 ENCOUNTER — Ambulatory Visit: Payer: Medicaid Other | Attending: Otolaryngology | Admitting: Otolaryngology

## 2015-07-07 ENCOUNTER — Ambulatory Visit (HOSPITAL_BASED_OUTPATIENT_CLINIC_OR_DEPARTMENT_OTHER): Payer: Medicaid Other | Admitting: Audiologist

## 2015-07-07 VITALS — BP 120/76 | HR 88 | Temp 98.0°F | Ht 64.0 in | Wt 175.0 lb

## 2015-07-07 DIAGNOSIS — M26609 Unspecified temporomandibular joint disorder, unspecified side: Secondary | ICD-10-CM | POA: Insufficient documentation

## 2015-07-07 DIAGNOSIS — Z6830 Body mass index (BMI) 30.0-30.9, adult: Secondary | ICD-10-CM

## 2015-07-07 DIAGNOSIS — H905 Unspecified sensorineural hearing loss: Secondary | ICD-10-CM

## 2015-07-07 DIAGNOSIS — Z8781 Personal history of (healed) traumatic fracture: Secondary | ICD-10-CM | POA: Insufficient documentation

## 2015-07-07 DIAGNOSIS — H938X2 Other specified disorders of left ear: Secondary | ICD-10-CM | POA: Insufficient documentation

## 2015-07-07 DIAGNOSIS — J309 Allergic rhinitis, unspecified: Secondary | ICD-10-CM | POA: Insufficient documentation

## 2015-07-07 DIAGNOSIS — Z8669 Personal history of other diseases of the nervous system and sense organs: Secondary | ICD-10-CM

## 2015-07-07 DIAGNOSIS — H9190 Unspecified hearing loss, unspecified ear: Secondary | ICD-10-CM

## 2015-07-07 DIAGNOSIS — F0781 Postconcussional syndrome: Secondary | ICD-10-CM | POA: Insufficient documentation

## 2015-07-07 DIAGNOSIS — H9202 Otalgia, left ear: Secondary | ICD-10-CM | POA: Insufficient documentation

## 2015-07-07 DIAGNOSIS — Z79899 Other long term (current) drug therapy: Secondary | ICD-10-CM | POA: Insufficient documentation

## 2015-07-07 NOTE — Progress Notes (Signed)
AUDIOGRAM    Hx:  Left ear pain, fullness with some dizziness per patient report.    IMP: Hearing is WNL bilaterally. Type A tymps bilaterally.     REC: To see Dr. Claybon JabsHurst today.    [facility]
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
## 2015-11-06 ENCOUNTER — Other Ambulatory Visit: Payer: Self-pay

## 2016-11-11 ENCOUNTER — Other Ambulatory Visit: Payer: Self-pay

## 2019-10-06 ENCOUNTER — Other Ambulatory Visit: Payer: Self-pay

## 2019-10-06 ENCOUNTER — Encounter: Payer: Self-pay | Admitting: Emergency Medicine

## 2019-10-06 ENCOUNTER — Emergency Department: Payer: Medicaid Other

## 2019-10-06 ENCOUNTER — Emergency Department
Admission: EM | Admit: 2019-10-06 | Discharge: 2019-10-06 | Disposition: A | Discharge status: Home / Self Care | Payer: Medicaid Other | Attending: Emergency Medicine | Admitting: Emergency Medicine

## 2019-10-06 DIAGNOSIS — Z20822 Contact with and (suspected) exposure to covid-19: Secondary | ICD-10-CM | POA: Insufficient documentation

## 2019-10-06 DIAGNOSIS — R319 Hematuria, unspecified: Secondary | ICD-10-CM | POA: Insufficient documentation

## 2019-10-06 DIAGNOSIS — R0789 Other chest pain: Secondary | ICD-10-CM | POA: Diagnosis not present

## 2019-10-06 HISTORY — DX: Endometriosis, unspecified: N80.9

## 2019-10-06 HISTORY — DX: Other specified disorders of bone density and structure, unspecified site: M85.80

## 2019-10-06 HISTORY — DX: Other seasonal allergic rhinitis: J30.2

## 2019-10-06 LAB — RESPIRATORY PANEL BY RT PCR (FLU A&B, COVID)
Influenza A by PCR: NEGATIVE
Influenza B by PCR: NEGATIVE
SARS Coronavirus 2 by RT PCR: NEGATIVE

## 2019-10-06 LAB — HEPATIC FUNCTION PANEL
ALT: 14 U/L (ref 0–44)
AST: 17 U/L (ref 15–41)
Albumin: 4.5 g/dL (ref 3.5–5.0)
Alkaline Phosphatase: 88 U/L (ref 38–126)
Bilirubin, Direct: <0.1 mg/dL (ref 0.0–0.2)
Total Bilirubin: 0.5 mg/dL (ref 0.3–1.2)
Total Protein: 7.3 g/dL (ref 6.5–8.1)

## 2019-10-06 LAB — CBC
HCT: 41.3 % (ref 36.0–46.0)
Hemoglobin: 13.7 g/dL (ref 12.0–15.0)
MCH: 31.3 pg (ref 26.0–34.0)
MCHC: 33.2 g/dL (ref 30.0–36.0)
MCV: 94.3 fL (ref 80.0–100.0)
Platelets: 315 10*3/uL (ref 150–400)
RBC: 4.38 MIL/uL (ref 3.87–5.11)
RDW: 12.1 % (ref 11.5–15.5)
WBC: 6.4 10*3/uL (ref 4.0–10.5)
nRBC: 0 % (ref 0.0–0.2)

## 2019-10-06 LAB — URINALYSIS, COMPLETE (UACMP) WITH MICROSCOPIC
Bilirubin Urine: NEGATIVE
Glucose, UA: NEGATIVE mg/dL
Ketones, ur: 5 mg/dL — AB
Leukocytes,Ua: NEGATIVE
Nitrite: NEGATIVE
Protein, ur: NEGATIVE mg/dL
Specific Gravity, Urine: 1.018 (ref 1.005–1.030)
pH: 7 (ref 5.0–8.0)

## 2019-10-06 LAB — BASIC METABOLIC PANEL
Anion gap: 9 (ref 5–15)
BUN: 7 mg/dL (ref 6–20)
CO2: 22 mmol/L (ref 22–32)
Calcium: 9.2 mg/dL (ref 8.9–10.3)
Chloride: 109 mmol/L (ref 98–111)
Creatinine, Ser: 0.84 mg/dL (ref 0.44–1.00)
GFR calc Af Amer: >60 mL/min (ref >60)
GFR calc non Af Amer: >60 mL/min (ref >60)
Glucose, Bld: 139 mg/dL — ABNORMAL HIGH (ref 70–99)
Potassium: 3.5 mmol/L (ref 3.5–5.1)
Sodium: 140 mmol/L (ref 135–145)

## 2019-10-06 LAB — LIPASE, BLOOD: Lipase: 25 U/L (ref 11–51)

## 2019-10-06 LAB — TROPONIN I (HIGH SENSITIVITY)
Troponin I (High Sensitivity): 2 ng/L (ref <18)
Troponin I (High Sensitivity): <2 ng/L (ref <18)

## 2019-10-06 LAB — POCT PREGNANCY, URINE: Preg Test, Ur: NEGATIVE

## 2019-10-06 LAB — FIBRIN DERIVATIVES D-DIMER (ARMC ONLY): Fibrin derivatives D-dimer (ARMC): 137.77 ng/mL (FEU) (ref 0.00–499.00)

## 2019-10-06 MED ORDER — ASPIRIN 81 MG PO CHEW
324.0000 mg | CHEWABLE_TABLET | Freq: Once | ORAL | Status: AC
  Administered 2019-10-06: 324 mg via ORAL
  Filled 2019-10-06: qty 4

## 2019-10-06 NOTE — ED Provider Notes (Signed)
[facility] Emergency Department Provider Note   ____________________________________________   First MD Initiated Contact with Patient 10/06/19 1240     (approximate)  I have reviewed the triage vital signs and the nursing notes.   HISTORY  Chief Complaint Chest Pain    HPI Madison Martin is a 34 y.o. female history of endometriosis and tubal ligation  Patient reports that this morning she got up to go to work and she was having some nausea, then if she is driving to work at about 6 AM started develop a left-sided chest pressure and it radiated somewhat down into her arm.  She reported this when she got to work and they recommended she come to the ER.  She works as a Engineer, civil (consulting) here.  She reports that she has had episodes like this several times in her life that will come and go last about 20 minutes and believed to due to anxiety in the past but has never had a cardiology work-up  She does take a oral estrogen tablet for endometriosis but no other medication.  Does not smoke, never has.  No history of heart disease.  No fevers chills cough.  No shortness of breath.  No leg swelling other than typical small amount around her ankles after working  No calf pain.  No history of blood clots.  No recent surgeries or trauma.  Has a history of intermittent issues with her gallbladder, but reports this pain was not located in her abdomen and nothing similar in nature.  Had some nausea which is now resolved.  Symptoms have resolved while waiting, she did notice there is a slightly sharp component as well with deep inspiration over the right upper chest which seems to be improved as well   Past Medical History:  Diagnosis Date  . Endometriosis   . Osteopenia   . Seasonal allergies     There are no problems to display for this patient.   Past Surgical History:  Procedure Laterality Date  . CESAREAN SECTION     x 2    Prior to Admission medications   Not on  File    Allergies Rocephin [ceftriaxone], Zithromax [azithromycin], Prednisone, Bactrim [sulfamethoxazole-trimethoprim], Ciprofloxacin, Monistat [miconazole], and Neosporin [bacitracin-polymyxin b]  History reviewed. No pertinent family history.  Social History Social History   Tobacco Use  . Smoking status: Never Smoker  . Smokeless tobacco: Never Used  Substance Use Topics  . Alcohol use: Yes    Comment: Occasional  . Drug use: Not Currently    Review of Systems Constitutional: No fever/chills Eyes: No visual changes. ENT: No sore throat. Cardiovascular: See HPI Respiratory: Denies shortness of breath. Gastrointestinal: No abdominal pain.  Had nausea Genitourinary: Negative for dysuria.  She did have a small amount of blood in her urine and flank pain about 2 weeks ago, reports a long history of kidney stones and this has resolved.  Denies pregnancy, but does reports her menstrual cycles are regular but has had a tubal ligation. Musculoskeletal: Negative for back pain. Skin: Negative for rash. Neurological: Negative for headaches, areas of focal weakness or numbness.    ____________________________________________   PHYSICAL EXAM:  VITAL SIGNS: ED Triage Vitals  Enc Vitals Group     BP 10/06/19 0713 116/74     Pulse Rate 10/06/19 0713 84     Resp 10/06/19 0713 20     Temp 10/06/19 0713 98.2 F (36.8 C)     Temp Source 10/06/19 0713 Oral  SpO2 10/06/19 0713 98 %     Weight 10/06/19 0709 190 lb (86.2 kg)     Height 10/06/19 0709 5\' 4"  (1.626 m)     Head Circumference --      Peak Flow --      Pain Score 10/06/19 0709 5     Pain Loc --      Pain Edu? --      Excl. in GC? --     Constitutional: Alert and oriented. Well appearing and in no acute distress. Eyes: Conjunctivae are normal. Head: Atraumatic. Nose: No congestion/rhinnorhea. Mouth/Throat: Mucous membranes are moist. Neck: No stridor.  Cardiovascular: Normal rate, regular rhythm. Grossly  normal heart sounds.  Good peripheral circulation. Respiratory: Normal respiratory effort.  No retractions. Lungs CTAB. Gastrointestinal: Soft and nontender. No distention. Musculoskeletal: No lower extremity tenderness nor edema.  Ports some very mild right costovertebral angle tenderness to percussion.  None on the left.  Denies associate abdominal pain.Neurologic:  Normal speech and language. No gross focal neurologic deficits are appreciated.  Skin:  Skin is warm, dry and intact. No rash noted. Psychiatric: Mood and affect are normal. Speech and behavior are normal.  ____________________________________________   LABS (all labs ordered are listed, but only abnormal results are displayed)  Labs Reviewed  BASIC METABOLIC PANEL - Abnormal; Notable for the following components:      Result Value   Glucose, Bld 139 (*)    All other components within normal limits  URINALYSIS, COMPLETE (UACMP) WITH MICROSCOPIC - Abnormal; Notable for the following components:   Color, Urine YELLOW (*)    APPearance CLOUDY (*)    Hgb urine dipstick SMALL (*)    Ketones, ur 5 (*)    Bacteria, UA RARE (*)    All other components within normal limits  RESPIRATORY PANEL BY RT PCR (FLU A&B, COVID)  URINE CULTURE  CBC  HEPATIC FUNCTION PANEL  LIPASE, BLOOD  FIBRIN DERIVATIVES D-DIMER ([facility] ONLY)  POC URINE PREG, ED  POCT PREGNANCY, URINE  TROPONIN I (HIGH SENSITIVITY)  TROPONIN I (HIGH SENSITIVITY)   ____________________________________________  EKG  ED ECG REPORT I, 10/08/19, the attending physician, personally viewed and interpreted this ECG.  Date: 10/06/2019 EKG Time: 7 AM Rate: 85 Rhythm: normal sinus rhythm QRS Axis: normal Intervals: normal ST/T Wave abnormalities: normal Narrative Interpretation: no evidence of acute ischemia  ____________________________________________  RADIOLOGY  DG Chest 2 View  Result Date: 10/06/2019 CLINICAL DATA:  Chest pain.  Shortness of breath.  EXAM: CHEST - 2 VIEW COMPARISON:  None. FINDINGS: Mediastinum hilar structures normal. No focal infiltrate. No pleural effusion or pneumothorax. Heart size normal. No acute bony abnormality. IMPRESSION: No acute cardiopulmonary disease. Electronically Signed   By: 10/08/2019  Register   On: 10/06/2019 07:31    Chest x-ray reviewed normal, I personally viewed the patient's chest film, it appears normal ____________________________________________   PROCEDURES  Procedure(s) performed: None  Procedures  Critical Care performed: No  ____________________________________________   INITIAL IMPRESSION / ASSESSMENT AND PLAN / ED COURSE  Pertinent labs & imaging results that were available during my care of the patient were reviewed by me and considered in my medical decision making (see chart for details).   Differential diagnosis includes, but is not limited to, ACS, aortic dissection, pulmonary embolism, cardiac tamponade, pneumothorax, pneumonia, pericarditis, myocarditis, GI-related causes including esophagitis/gastritis, and musculoskeletal chest wall pain.  No ripping tearing or pain that would suggest dissection.  Chest x-ray normal.  No infectious symptoms.  Reassuring that  the patient's pain is improved.  Currently resting comfortably.  Symptoms have resolved both troponins normal and she is low risk by hear score for ACS.  She does have 1 risk factor noted for pulmonary embolism, though I suspect she is still low pretest probability.  We will send D-dimer.  Discussed this with the patient is in agreement.  No associated abdominal pain.  Symptoms improved.  Given working in healthcare, Covid test sent, result negative.        Clinical Course as of Oct 05 1529  Mon Oct 06, 2019  1448 Patient resting in no distress.  Awaiting D-dimer result.  Urine sent for urinalysis as nurse reported it appeared somewhat cloudy   [MQ]    Clinical Course User Index [MQ] Sharyn Creamer, MD    ----------------------------------------- 3:03 PM on 10/06/2019 -----------------------------------------  D-dimer is normal, very low risk pulmonary embolism.  Return precautions and treatment recommendations and follow-up discussed with the patient who is agreeable with the plan.   ____________________________________________   FINAL CLINICAL IMPRESSION(S) / ED DIAGNOSES  Final diagnoses:  Atypical chest pain        Note:  This document was prepared using Dragon voice recognition software and may include unintentional dictation errors       Sharyn Creamer, MD 10/06/19 1531

## 2019-10-06 NOTE — ED Triage Notes (Signed)
Pt presents to ED via POV with c/o L side CP that radiates down her L arm. Pt states sudden onset this AM while getting ready for work. Pt states similar episode approx 1-2 weeks ago that was self limiting. Pt states 1 episode of emesis, states hx of emesis due to gallbladder issues.

## 2019-10-06 NOTE — ED Notes (Signed)
Pt c/o increasing L arm pain and pain in bilateral chest. Tiffany, RN made aware and informed this tech to repeat EKG.

## 2019-10-06 NOTE — ED Notes (Signed)
Patient declined discharge vital signs. 

## 2019-10-06 NOTE — Discharge Instructions (Addendum)

## 2019-10-07 LAB — URINE CULTURE: Culture: <10000 — AB

## 2020-10-15 DIAGNOSIS — G43009 Migraine without aura, not intractable, without status migrainosus: Secondary | ICD-10-CM | POA: Insufficient documentation

## 2021-06-01 DIAGNOSIS — R6510 Systemic inflammatory response syndrome (SIRS) of non-infectious origin without acute organ dysfunction: Secondary | ICD-10-CM | POA: Insufficient documentation

## 2021-09-19 DIAGNOSIS — R7309 Other abnormal glucose: Secondary | ICD-10-CM | POA: Insufficient documentation

## 2021-12-02 IMAGING — CR DG CHEST 2V
1 series · 2 of 2 positions shown · non-contrast
Comparison: None.

CLINICAL DATA: Chest pain.  Shortness of breath.

EXAM:
CHEST - 2 VIEW

[Series 1: dg chest 2 view · 0.14mm/px · 2 of 2 slices shown]
[im 1/2]
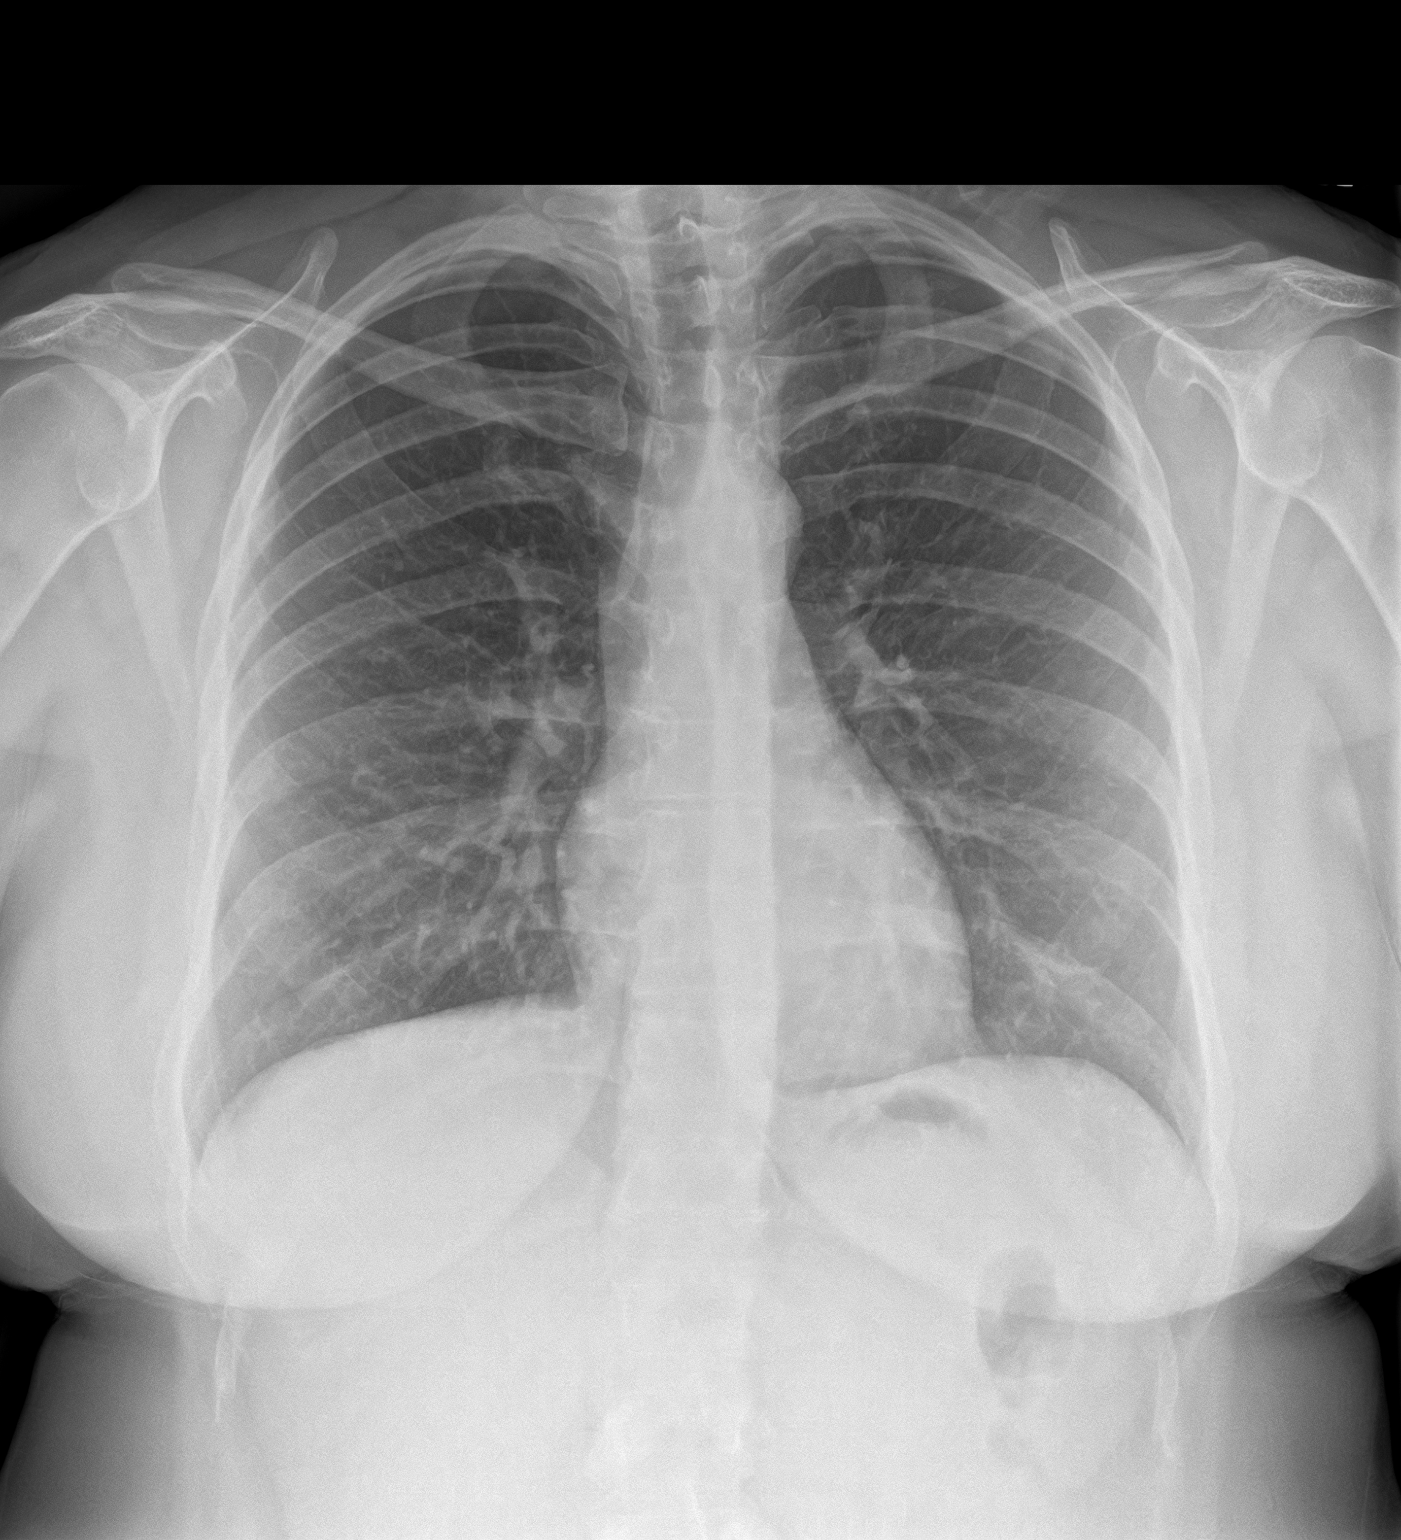
[im 2/2]
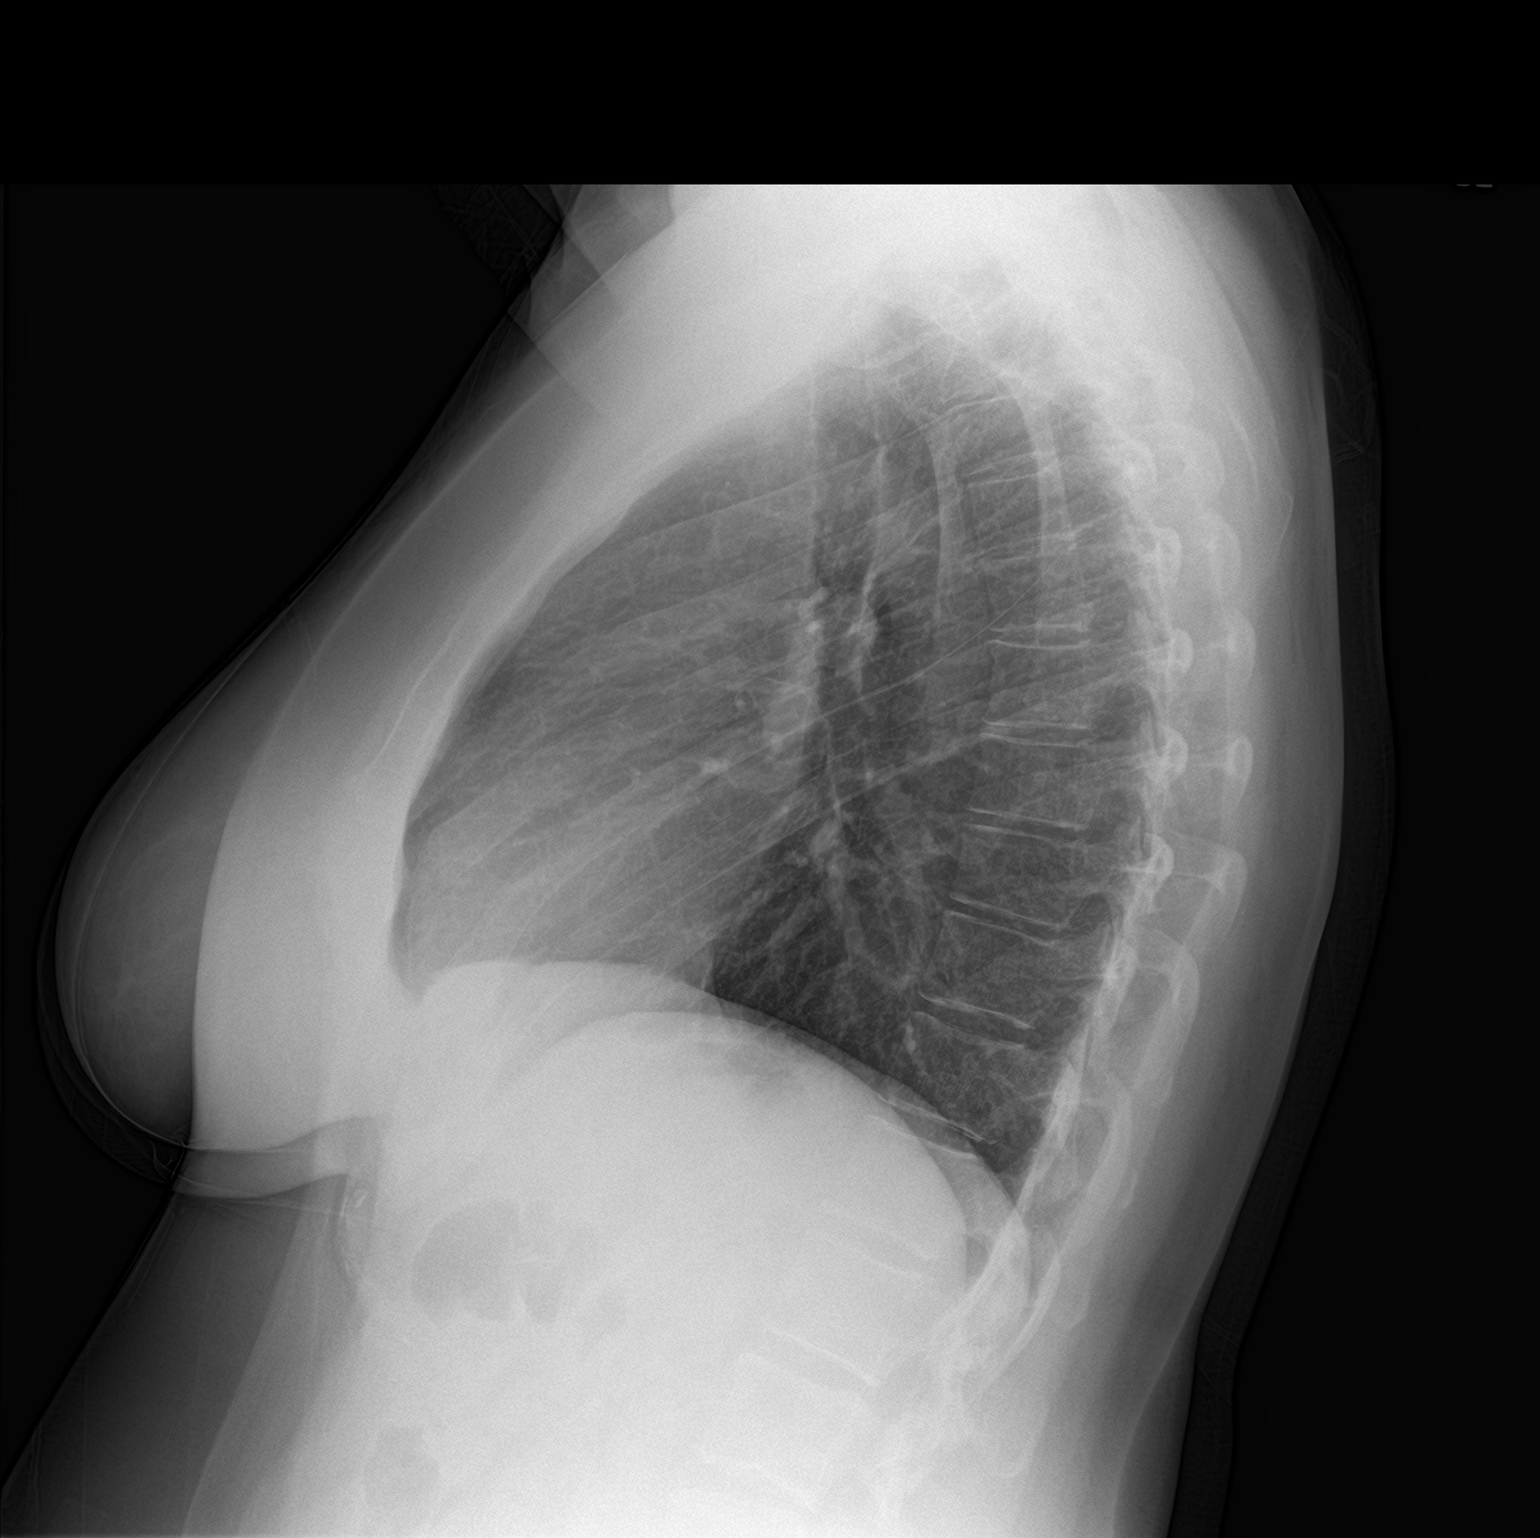

[2 of 2 positions shown; findings below may reference images not displayed]

FINDINGS: Mediastinum hilar structures normal. No focal infiltrate. No pleural
effusion or pneumothorax. Heart size normal. No acute bony
abnormality.
IMPRESSION: No acute cardiopulmonary disease.

## 2022-07-06 ENCOUNTER — Ambulatory Visit: Payer: Self-pay | Admitting: Nurse Practitioner

## 2022-08-11 ENCOUNTER — Ambulatory Visit: Payer: Medicaid Other | Admitting: Nurse Practitioner

## 2022-10-14 ENCOUNTER — Other Ambulatory Visit: Payer: Self-pay

## 2022-10-14 ENCOUNTER — Emergency Department: Payer: Self-pay

## 2022-10-14 ENCOUNTER — Emergency Department
Admission: EM | Admit: 2022-10-14 | Discharge: 2022-10-14 | Disposition: A | Discharge status: Home / Self Care | Payer: Self-pay | Attending: Emergency Medicine | Admitting: Emergency Medicine

## 2022-10-14 DIAGNOSIS — R110 Nausea: Secondary | ICD-10-CM | POA: Insufficient documentation

## 2022-10-14 DIAGNOSIS — R1031 Right lower quadrant pain: Secondary | ICD-10-CM | POA: Insufficient documentation

## 2022-10-14 LAB — COMPREHENSIVE METABOLIC PANEL
ALT: 24 U/L (ref 0–44)
AST: 21 U/L (ref 15–41)
Albumin: 4.6 g/dL (ref 3.5–5.0)
Alkaline Phosphatase: 106 U/L (ref 38–126)
Anion gap: 11 (ref 5–15)
BUN: 8 mg/dL (ref 6–20)
CO2: 25 mmol/L (ref 22–32)
Calcium: 9.5 mg/dL (ref 8.9–10.3)
Chloride: 104 mmol/L (ref 98–111)
Creatinine, Ser: 0.69 mg/dL (ref 0.44–1.00)
GFR, Estimated: >60 mL/min (ref >60)
Glucose, Bld: 100 mg/dL — ABNORMAL HIGH (ref 70–99)
Potassium: 3.9 mmol/L (ref 3.5–5.1)
Sodium: 140 mmol/L (ref 135–145)
Total Bilirubin: 0.6 mg/dL (ref 0.3–1.2)
Total Protein: 8.1 g/dL (ref 6.5–8.1)

## 2022-10-14 LAB — CBC
HCT: 41.5 % (ref 36.0–46.0)
Hemoglobin: 13.8 g/dL (ref 12.0–15.0)
MCH: 31 pg (ref 26.0–34.0)
MCHC: 33.3 g/dL (ref 30.0–36.0)
MCV: 93.3 fL (ref 80.0–100.0)
Platelets: 372 10*3/uL (ref 150–400)
RBC: 4.45 MIL/uL (ref 3.87–5.11)
RDW: 11.9 % (ref 11.5–15.5)
WBC: 9.6 10*3/uL (ref 4.0–10.5)
nRBC: 0 % (ref 0.0–0.2)

## 2022-10-14 LAB — WET PREP, GENITAL
Clue Cells Wet Prep HPF POC: NONE SEEN — AB
Sperm: NONE SEEN
Trich, Wet Prep: NONE SEEN — AB
WBC, Wet Prep HPF POC: <10 (ref <10)
Yeast Wet Prep HPF POC: NONE SEEN — AB

## 2022-10-14 LAB — CHLAMYDIA/NGC RT PCR (ARMC ONLY)
Chlamydia Tr: NOT DETECTED
N gonorrhoeae: NOT DETECTED

## 2022-10-14 LAB — URINALYSIS, ROUTINE W REFLEX MICROSCOPIC
Bilirubin Urine: NEGATIVE
Glucose, UA: NEGATIVE mg/dL
Hgb urine dipstick: NEGATIVE
Ketones, ur: NEGATIVE mg/dL
Leukocytes,Ua: NEGATIVE
Nitrite: NEGATIVE
Protein, ur: NEGATIVE mg/dL
Specific Gravity, Urine: 1.015 (ref 1.005–1.030)
pH: 6 (ref 5.0–8.0)

## 2022-10-14 LAB — LIPASE, BLOOD: Lipase: 28 U/L (ref 11–51)

## 2022-10-14 MED ORDER — IOHEXOL 300 MG/ML  SOLN
100.0000 mL | Freq: Once | INTRAMUSCULAR | Status: AC | PRN
  Administered 2022-10-14: 100 mL via INTRAVENOUS

## 2022-10-14 MED ORDER — ONDANSETRON HCL 4 MG PO TABS: 4.0000 mg | ORAL_TABLET | Freq: Three times a day (TID) | ORAL | 0 refills | Status: AC | PRN

## 2022-10-14 MED ORDER — OXYCODONE HCL 5 MG PO TABS: 5.0000 mg | ORAL_TABLET | Freq: Three times a day (TID) | ORAL | 0 refills | Status: DC | PRN

## 2022-10-14 MED ORDER — ONDANSETRON HCL 4 MG/2ML IJ SOLN
4.0000 mg | Freq: Once | INTRAMUSCULAR | Status: AC
  Administered 2022-10-14: 4 mg via INTRAVENOUS
  Filled 2022-10-14: qty 2

## 2022-10-14 MED ORDER — KETOROLAC TROMETHAMINE 15 MG/ML IJ SOLN
15.0000 mg | Freq: Once | INTRAMUSCULAR | Status: AC
  Administered 2022-10-14: 15 mg via INTRAVENOUS
  Filled 2022-10-14: qty 1

## 2022-10-14 MED ORDER — MORPHINE SULFATE (PF) 4 MG/ML IV SOLN
4.0000 mg | Freq: Once | INTRAVENOUS | Status: AC
  Administered 2022-10-14: 4 mg via INTRAVENOUS
  Filled 2022-10-14: qty 1

## 2022-10-14 NOTE — ED Provider Notes (Addendum)
[facility] Provider Note    Event Date/Time   First MD Initiated Contact with Patient 10/14/22 1141     (approximate)   History   Abdominal Pain   HPI Madison Martin is a 37 y.o. female presenting today for right lower quadrant abdominal pain.  Patient states she has had intermittent pain over the past 1.5 weeks but has become more consistent.  She has had nausea but no vomiting.  Does feel like some of the pain radiates into her right lower back as well.  Denies dysuria, hematuria.  Does note intermittent pain with sexual intercourse but denies any vaginal bleeding or vaginal discharge.  Has prior history of hysterectomy as well as multiple abdominal surgeries.  No history of appendectomy or cholecystectomy.  Denies fevers or chills.     Physical Exam   Triage Vital Signs: ED Triage Vitals  Encounter Vitals Group     BP 10/14/22 1133 (!) 145/89     Systolic BP Percentile --      Diastolic BP Percentile --      Pulse Rate 10/14/22 1133 89     Resp 10/14/22 1133 16     Temp 10/14/22 1133 98.7 F (37.1 C)     Temp Source 10/14/22 1133 Oral     SpO2 10/14/22 1133 100 %     Weight 10/14/22 1134 220 lb (99.8 kg)     Height 10/14/22 1134 5\' 5"  (1.651 m)     Head Circumference --      Peak Flow --      Pain Score 10/14/22 1133 7     Pain Loc --      Pain Education --      Exclude from Growth Chart --     Most recent vital signs: Vitals:   10/14/22 1516 10/14/22 1800  BP: 112/74 116/73  Pulse: 80 69  Resp: 15   Temp:    SpO2: 100% 100%   Physical Exam: I have reviewed the vital signs and nursing notes. General: Awake, alert, no acute distress.  Nontoxic appearing. Head:  Atraumatic, normocephalic.   ENT:  EOM intact, PERRL. Oral mucosa is pink and moist with no lesions. Neck: Neck is supple with full range of motion, No meningeal signs. Cardiovascular:  RRR, No murmurs. Peripheral pulses palpable and equal bilaterally. Respiratory:   Symmetrical chest wall expansion.  No rhonchi, rales, or wheezes.  Good air movement throughout.  No use of accessory muscles.   Musculoskeletal:  No cyanosis or edema. Moving extremities with full ROM Abdomen:  Soft, tenderness to palpation in right lower quadrant, nondistended.  Negative bilateral CVA tenderness  Neuro:  GCS 15, moving all four extremities, interacting appropriately. Speech clear. Psych:  Calm, appropriate.   Skin:  Warm, dry, no rash.    ED Results / Procedures / Treatments   Labs (all labs ordered are listed, but only abnormal results are displayed) Labs Reviewed  WET PREP, GENITAL - Abnormal; Notable for the following components:      Result Value   Yeast Wet Prep HPF POC NONE SEEN (*)    Trich, Wet Prep NONE SEEN (*)    Clue Cells Wet Prep HPF POC NONE SEEN (*)    All other components within normal limits  COMPREHENSIVE METABOLIC PANEL - Abnormal; Notable for the following components:   Glucose, Bld 100 (*)    All other components within normal limits  URINALYSIS, ROUTINE W REFLEX MICROSCOPIC - Abnormal; Notable for the following  components:   Color, Urine YELLOW (*)    APPearance HAZY (*)    All other components within normal limits  CHLAMYDIA/NGC RT PCR ([facility] ONLY)            LIPASE, BLOOD  CBC     EKG    RADIOLOGY Independently interpreted CT abdomen/pelvis and transvaginal ultrasound which shows no acute pathology.   PROCEDURES:  Critical Care performed: No  Procedures   MEDICATIONS ORDERED IN ED: Medications  ketorolac (TORADOL) 15 MG/ML injection 15 mg (15 mg Intravenous Given 10/14/22 1424)  ondansetron (ZOFRAN) injection 4 mg (4 mg Intravenous Given 10/14/22 1424)  morphine (PF) 4 MG/ML injection 4 mg (4 mg Intravenous Given 10/14/22 1508)  ondansetron (ZOFRAN) injection 4 mg (4 mg Intravenous Given 10/14/22 1514)  iohexol (OMNIPAQUE) 300 MG/ML solution 100 mL (100 mLs Intravenous Contrast Given 10/14/22 1520)  morphine (PF) 4 MG/ML  injection 4 mg (4 mg Intravenous Given 10/14/22 1757)     IMPRESSION / MDM / ASSESSMENT AND PLAN / ED COURSE  I reviewed the triage vital signs and the nursing notes.                              Differential diagnosis includes, but is not limited to, appendicitis, ovarian torsion, ovarian cyst, ovarian abscess, small bowel obstruction, colitis, nephrolithiasis, acute cystitis, pyelonephritis.  Patient's presentation is most consistent with acute complicated illness / injury requiring diagnostic workup.  Patient is a 37 year old female presenting today for 1.5 weeks of right lower quadrant pain now more persistent associated with nausea.  Vital signs are stable on arrival and noted tenderness to palpation in right lower quadrant as well as right lower back without CVA tenderness.  History of a hysterectomy but initially concern for possible ovarian pathology.  Laboratory workup was reassuring with normal CBC, CMP, and lipase.  Transvaginal ultrasound shows no acute pathology in the ovaries.  Given ongoing pain symptoms, CT abdomen/pelvis was ordered for further evaluation.  This also showed no acute pathology.  Patient was reassessed throughout ultrasounds and CT imaging requiring frequent dosing of pain medication with Toradol and morphine x 2 along with Zofran x 2.  She still had persistence of pain symptoms with unclear etiology and reassuring workup.  Given hysterectomy, patient has a low likelihood for pelvic inflammatory disease, however she has noted some pain with intercourse.  Wet prep and GC test were ordered.  Wet prep was negative.  Gonorrhea and Chlamydia test was also negative.  Patient was reassessed with pain rated as 2 out of 10 at this time.  I do not see any emergent causes at this time and do not think she needs any additional workup.  The emergency department right now.  She has gynecology follow-up already planned for this upcoming week and I agree given her history that this is  probably the best neck step in evaluation.  Patient feels comfortable going home and will send home with pain and nausea medication.  She was given strict return precautions for worsening symptoms.  Clinical Course as of 10/14/22 1935  Sat Oct 14, 2022  1331 CBC Unremarkable [DW]  1331 Urinalysis, Routine w reflex microscopic -Urine, Clean Catch(!) No UTI [DW]  1344 Comprehensive metabolic panel(!) Unremarkable [DW]  1344 Lipase: 28 [DW]  1535 US PELVIC COMPLETE W TRANSVAGINAL AND TORSION R/O Transvaginal ultrasound unremarkable [DW]  1808 Wet prep, genital(!) Negative [DW]    Clinical Course User Index [DW]  Janith Lima, MD     FINAL CLINICAL IMPRESSION(S) / ED DIAGNOSES   Final diagnoses:  Right lower quadrant abdominal pain     Rx / DC Orders   ED Discharge Orders          Ordered    oxyCODONE (ROXICODONE) 5 MG immediate release tablet  Every 8 hours PRN        10/14/22 1935    ondansetron (ZOFRAN) 4 MG tablet  Every 8 hours PRN        10/14/22 1935             Note:  This document was prepared using Dragon voice recognition software and may include unintentional dictation errors.   Janith Lima, MD 10/14/22 Barnie Mort    Janith Lima, MD 10/14/22 [phone]

## 2022-10-14 NOTE — ED Provider Notes (Incomplete)
----------------------------------------- 7:06 PM on 10/14/2022 -----------------------------------------  Blood pressure 116/73, pulse 69, temperature 98.7 F (37.1 C), temperature source Oral, resp. rate 15, height 5\' 5"  (1.651 m), weight 99.8 kg, SpO2 100%.  Assuming care from Dr. Anner Crete.  In short, Madison Martin is a 37 y.o. female with a chief complaint of Abdominal Pain .  Refer to the original H&P for additional details.  The current plan of care is to wait for G/C swab, if negative she can be discharged with pain medication and follow up with OB.  ____________________________________________    ED Results / Procedures / Treatments   Labs (all labs ordered are listed, but only abnormal results are displayed) Labs Reviewed  WET PREP, GENITAL - Abnormal; Notable for the following components:      Result Value   Yeast Wet Prep HPF POC NONE SEEN (*)    Trich, Wet Prep NONE SEEN (*)    Clue Cells Wet Prep HPF POC NONE SEEN (*)    All other components within normal limits  COMPREHENSIVE METABOLIC PANEL - Abnormal; Notable for the following components:   Glucose, Bld 100 (*)    All other components within normal limits  URINALYSIS, ROUTINE W REFLEX MICROSCOPIC - Abnormal; Notable for the following components:   Color, Urine YELLOW (*)    APPearance HAZY (*)    All other components within normal limits  CHLAMYDIA/NGC RT PCR ([facility] ONLY)            LIPASE, BLOOD  CBC     EKG  ***   RADIOLOGY  {**I personally viewed and evaluated these images as part of my medical decision making, as well as reviewing the written report by the radiologist.  ED Provider Interpretation: ***}  CT ABDOMEN PELVIS W CONTRAST  Result Date: 10/14/2022 CLINICAL DATA:  Worsening right lower quadrant pain for 1.5 weeks. EXAM: CT ABDOMEN AND PELVIS WITH CONTRAST TECHNIQUE: Multidetector CT imaging of the abdomen and pelvis was performed using the standard protocol following bolus administration  of intravenous contrast. RADIATION DOSE REDUCTION: This exam was performed according to the departmental dose-optimization program which includes automated exposure control, adjustment of the mA and/or kV according to patient size and/or use of iterative reconstruction technique. CONTRAST:  OMNIPAQUE IOHEXOL 300 MG/ML  SOLN COMPARISON:  02/12/2020 FINDINGS: Lower Chest: No acute findings. Hepatobiliary: No suspicious hepatic masses identified. Gallbladder is unremarkable. No evidence of biliary ductal dilatation. Pancreas:  No mass or inflammatory changes. Spleen: Within normal limits in size and appearance. Adrenals/Urinary Tract: No suspicious masses identified. No evidence of ureteral calculi or hydronephrosis. Stomach/Bowel: No evidence of obstruction, inflammatory process or abnormal fluid collections. Normal appendix visualized. Vascular/Lymphatic: No pathologically enlarged lymph nodes. No acute vascular findings. Reproductive: Prior hysterectomy noted. Adnexal regions are unremarkable in appearance. Other:  None. Musculoskeletal:  No suspicious bone lesions identified. IMPRESSION: Negative. No radiographic evidence of appendicitis or other significant abnormality. Electronically Signed   By: Danae Orleans M.D.   On: 10/14/2022 16:47   US PELVIC COMPLETE W TRANSVAGINAL AND TORSION R/O  Result Date: 10/14/2022 CLINICAL DATA:  Right lower quadrant history of hysterectomy. EXAM: TRANSABDOMINAL AND TRANSVAGINAL ULTRASOUND OF PELVIS DOPPLER ULTRASOUND OF OVARIES TECHNIQUE: Both transabdominal and transvaginal ultrasound examinations of the pelvis were performed. Transabdominal technique was performed for global imaging of the pelvis including uterus, ovaries, adnexal regions, and pelvic cul-de-sac. It was necessary to proceed with endovaginal exam following the transabdominal exam to visualize the ovaries and adnexa. Color and duplex Doppler ultrasound was  utilized to evaluate blood flow to the ovaries.  COMPARISON:  None Available. FINDINGS: Uterus Surgically absent.  No abnormality of the vaginal cuff. Right ovary Measurements: 2.7 x 2.1 x 2.7 cm = volume: 8.2 mL. Physiologic follicles. Normal blood flow. No cyst or solid lesion. No adnexal mass. Left ovary Measurements: 2.4 x 1.6 x 2.7 cm = volume: 5.3 mL. Normal quiescent appearance. Small physiologic follicles. Normal blood flow. No cyst or solid lesion. No adnexal mass. Pulsed Doppler evaluation of both ovaries demonstrates normal low-resistance arterial and venous waveforms. Other findings No abnormal free fluid. IMPRESSION: 1. Normal sonographic appearance of the ovaries. Normal ovarian blood flow. No ovarian torsion. 2. Hysterectomy. Electronically Signed   By: Narda Rutherford M.D.   On: 10/14/2022 15:19     PROCEDURES:  Critical Care performed: {CriticalCareYesNo:19197::"Yes, see critical care procedure note(s)","No"}  Procedures   MEDICATIONS ORDERED IN ED: Medications  ketorolac (TORADOL) 15 MG/ML injection 15 mg (15 mg Intravenous Given 10/14/22 1424)  ondansetron (ZOFRAN) injection 4 mg (4 mg Intravenous Given 10/14/22 1424)  morphine (PF) 4 MG/ML injection 4 mg (4 mg Intravenous Given 10/14/22 1508)  ondansetron (ZOFRAN) injection 4 mg (4 mg Intravenous Given 10/14/22 1514)  iohexol (OMNIPAQUE) 300 MG/ML solution 100 mL (100 mLs Intravenous Contrast Given 10/14/22 1520)  morphine (PF) 4 MG/ML injection 4 mg (4 mg Intravenous Given 10/14/22 1757)     IMPRESSION / MDM / ASSESSMENT AND PLAN / ED COURSE  I reviewed the triage vital signs and the nursing notes.                              Differential diagnosis includes, but is not limited to, ***  Patient's presentation is most consistent with {EM COPA:27473}  {**The patient is on the cardiac monitor to evaluate for evidence of arrhythmia and/or significant heart rate changes.**}  Patient's diagnosis is consistent with ***. Patient will be discharged home with prescriptions for  ***. Patient is to follow up with *** as needed or otherwise directed. Patient is given ED precautions to return to the ED for any worsening or new symptoms.  Clinical Course as of 10/14/22 1906  Sat Oct 14, 2022  1331 CBC Unremarkable [DW]  1331 Urinalysis, Routine w reflex microscopic -Urine, Clean Catch(!) No UTI [DW]  1344 Comprehensive metabolic panel(!) Unremarkable [DW]  1344 Lipase: 28 [DW]  1535 US PELVIC COMPLETE W TRANSVAGINAL AND TORSION R/O Transvaginal ultrasound unremarkable [DW]  1808 Wet prep, genital(!) Negative [DW]    Clinical Course User Index [DW] Janith Lima, MD    FINAL CLINICAL IMPRESSION(S) / ED DIAGNOSES   Final diagnoses:  None     Rx / DC Orders   ED Discharge Orders     None        Note:  This document was prepared using Dragon voice recognition software and may include unintentional dictation errors.

## 2022-10-14 NOTE — ED Notes (Signed)
Patient requested Toradol and Zofran at this time. EDP notified.

## 2022-10-14 NOTE — ED Notes (Signed)
Patient transported to CT 

## 2022-10-14 NOTE — ED Notes (Signed)
Unsuccessful at obtaining blood in triage

## 2022-10-14 NOTE — Discharge Instructions (Signed)
CT imaging and ultrasound imaging showed no acute abnormalities and laboratory workup was reassuring today.  Please follow-up with your gynecologist as planned for further evaluation.  I have sent pain medication and nausea medication to your pharmacy to help treat symptoms until your follow-up.  Please return for any worsening symptoms.

## 2022-10-14 NOTE — ED Triage Notes (Addendum)
Pt to ED c/o right lower abdominal pain x 1.5 weeks. Progressively getting worse.  radiating to back . Denies urinary symptoms. Reports hx of multiple abdominal sxs. No N/V/D

## 2022-10-19 ENCOUNTER — Other Ambulatory Visit: Payer: Self-pay

## 2022-10-19 ENCOUNTER — Emergency Department
Admission: EM | Admit: 2022-10-19 | Discharge: 2022-10-19 | Disposition: A | Discharge status: Home / Self Care | Payer: Self-pay | Attending: Emergency Medicine | Admitting: Emergency Medicine

## 2022-10-19 ENCOUNTER — Emergency Department: Payer: Self-pay

## 2022-10-19 DIAGNOSIS — R1031 Right lower quadrant pain: Secondary | ICD-10-CM | POA: Insufficient documentation

## 2022-10-19 LAB — CBC WITH DIFFERENTIAL/PLATELET
Abs Immature Granulocytes: 0.01 10*3/uL (ref 0.00–0.07)
Basophils Absolute: 0.1 10*3/uL (ref 0.0–0.1)
Basophils Relative: 1 %
Eosinophils Absolute: 0.1 10*3/uL (ref 0.0–0.5)
Eosinophils Relative: 1 %
HCT: 40.6 % (ref 36.0–46.0)
Hemoglobin: 13.4 g/dL (ref 12.0–15.0)
Immature Granulocytes: 0 %
Lymphocytes Relative: 30 %
Lymphs Abs: 1.9 10*3/uL (ref 0.7–4.0)
MCH: 30.9 pg (ref 26.0–34.0)
MCHC: 33 g/dL (ref 30.0–36.0)
MCV: 93.8 fL (ref 80.0–100.0)
Monocytes Absolute: 0.4 10*3/uL (ref 0.1–1.0)
Monocytes Relative: 7 %
Neutro Abs: 3.9 10*3/uL (ref 1.7–7.7)
Neutrophils Relative %: 61 %
Platelets: 351 10*3/uL (ref 150–400)
RBC: 4.33 MIL/uL (ref 3.87–5.11)
RDW: 11.9 % (ref 11.5–15.5)
WBC: 6.3 10*3/uL (ref 4.0–10.5)
nRBC: 0 % (ref 0.0–0.2)

## 2022-10-19 LAB — COMPREHENSIVE METABOLIC PANEL
ALT: 22 U/L (ref 0–44)
AST: 19 U/L (ref 15–41)
Albumin: 4.3 g/dL (ref 3.5–5.0)
Alkaline Phosphatase: 85 U/L (ref 38–126)
Anion gap: 11 (ref 5–15)
BUN: 9 mg/dL (ref 6–20)
CO2: 22 mmol/L (ref 22–32)
Calcium: 9 mg/dL (ref 8.9–10.3)
Chloride: 103 mmol/L (ref 98–111)
Creatinine, Ser: 0.58 mg/dL (ref 0.44–1.00)
GFR, Estimated: >60 mL/min (ref >60)
Glucose, Bld: 98 mg/dL (ref 70–99)
Potassium: 3.7 mmol/L (ref 3.5–5.1)
Sodium: 136 mmol/L (ref 135–145)
Total Bilirubin: 0.7 mg/dL (ref 0.3–1.2)
Total Protein: 7.7 g/dL (ref 6.5–8.1)

## 2022-10-19 LAB — URINALYSIS, ROUTINE W REFLEX MICROSCOPIC
Bilirubin Urine: NEGATIVE
Glucose, UA: NEGATIVE mg/dL
Hgb urine dipstick: NEGATIVE
Ketones, ur: NEGATIVE mg/dL
Leukocytes,Ua: NEGATIVE
Nitrite: NEGATIVE
Protein, ur: NEGATIVE mg/dL
Specific Gravity, Urine: 1.008 (ref 1.005–1.030)
pH: 8 (ref 5.0–8.0)

## 2022-10-19 LAB — LACTIC ACID, PLASMA: Lactic Acid, Venous: 1.4 mmol/L (ref 0.5–1.9)

## 2022-10-19 LAB — LIPASE, BLOOD: Lipase: 28 U/L (ref 11–51)

## 2022-10-19 MED ORDER — FENTANYL CITRATE PF 50 MCG/ML IJ SOSY: 50.0000 ug | PREFILLED_SYRINGE | Freq: Once | INTRAMUSCULAR | Status: DC

## 2022-10-19 MED ORDER — ONDANSETRON HCL 4 MG/2ML IJ SOLN
4.0000 mg | Freq: Once | INTRAMUSCULAR | Status: AC
  Administered 2022-10-19: 4 mg via INTRAVENOUS
  Filled 2022-10-19: qty 2

## 2022-10-19 MED ORDER — FENTANYL CITRATE PF 50 MCG/ML IJ SOSY
25.0000 ug | PREFILLED_SYRINGE | Freq: Once | INTRAMUSCULAR | Status: AC
  Administered 2022-10-19: 25 ug via INTRAVENOUS
  Filled 2022-10-19: qty 1

## 2022-10-19 MED ORDER — MELOXICAM 15 MG PO TABS: 15.0000 mg | ORAL_TABLET | Freq: Every day | ORAL | 0 refills | Status: AC

## 2022-10-19 MED ORDER — CYCLOBENZAPRINE HCL 10 MG PO TABS: 10.0000 mg | ORAL_TABLET | Freq: Three times a day (TID) | ORAL | 0 refills | Status: DC | PRN

## 2022-10-19 MED ORDER — SODIUM CHLORIDE 0.9 % IV BOLUS
1000.0000 mL | Freq: Once | INTRAVENOUS | Status: AC
  Administered 2022-10-19: 1000 mL via INTRAVENOUS

## 2022-10-19 MED ORDER — IOHEXOL 300 MG/ML  SOLN
100.0000 mL | Freq: Once | INTRAMUSCULAR | Status: AC | PRN
  Administered 2022-10-19: 100 mL via INTRAVENOUS

## 2022-10-19 MED ORDER — HYDROCODONE-ACETAMINOPHEN 5-325 MG PO TABS: 1.0000 | ORAL_TABLET | ORAL | 0 refills | Status: DC | PRN

## 2022-10-19 MED ORDER — KETOROLAC TROMETHAMINE 30 MG/ML IJ SOLN
30.0000 mg | Freq: Once | INTRAMUSCULAR | Status: AC
  Administered 2022-10-19: 30 mg via INTRAVENOUS
  Filled 2022-10-19: qty 1

## 2022-10-19 NOTE — ED Triage Notes (Signed)
Pt to ed from home via POV for abd pain x 2 weeks. Pt was seen here on Saturday for same. All tests results were normal. Pt is caox4, in no acute distress and ambulatory in triage.

## 2022-10-19 NOTE — ED Notes (Signed)
See triage note  Presents with abd pain States this started 2 weeks ago   pos n/v  Afebrile on arrival

## 2022-10-19 NOTE — ED Provider Notes (Signed)
[facility] Provider Note   Event Date/Time   First MD Initiated Contact with Patient 10/19/22 [id number]     (approximate) History  Abdominal Pain  HPI Madison Martin is a 37 y.o. female with a past medical history of endometriosis status post exploratory laparoscopy with removal who presents complaining of right lower quadrant pain over the last 2 weeks.  Patient states that she was seen and evaluated 2 weeks prior to arrival and had negative workup including negative CT of the abdomen and pelvis however the pain has persisted with elevated temperatures to 54F, nausea, diarrhea, and a leukocytosis on laboratory evaluation by OB/GYN earlier this week. ROS: Patient currently denies any vision changes, tinnitus, difficulty speaking, facial droop, sore throat, chest pain, shortness of breath, dysuria, or weakness/numbness/paresthesias in any extremity   Physical Exam  Triage Vital Signs: ED Triage Vitals [10/19/22 0751]  Encounter Vitals Group     BP (!) 148/96     Systolic BP Percentile      Diastolic BP Percentile      Pulse Rate 90     Resp 16     Temp 98.4 F (36.9 C)     Temp Source Oral     SpO2 100 %     Weight 218 lb 4.1 oz (99 kg)     Height 5\' 5"  (1.651 m)     Head Circumference      Peak Flow      Pain Score 7     Pain Loc      Pain Education      Exclude from Growth Chart    Most recent vital signs: Vitals:   10/19/22 0751  BP: (!) 148/96  Pulse: 90  Resp: 16  Temp: 98.4 F (36.9 C)  SpO2: 100%   General: Awake, oriented x4. CV:  Good peripheral perfusion.  Resp:  Normal effort.  Abd:  No distention.  Other:  Middle-aged Caucasian female resting comfortably in no acute distress ED Results / Procedures / Treatments  Labs (all labs ordered are listed, but only abnormal results are displayed) Labs Reviewed  URINALYSIS, ROUTINE W REFLEX MICROSCOPIC - Abnormal; Notable for the following components:      Result Value   Color, Urine  STRAW (*)    APPearance HAZY (*)    All other components within normal limits  COMPREHENSIVE METABOLIC PANEL  LACTIC ACID, PLASMA  LIPASE, BLOOD  CBC WITH DIFFERENTIAL/PLATELET   RADIOLOGY ED MD interpretation: CT of the abdomen pelvis with IV contrast shows no evidence of acute abnormality within the abdomen or pelvis -Agree with radiology assessment Official radiology report(s): CT ABDOMEN PELVIS W CONTRAST  Result Date: 10/19/2022 CLINICAL DATA:  Right lower quadrant abdominal pain, fever and diarrhea EXAM: CT ABDOMEN AND PELVIS WITH CONTRAST TECHNIQUE: Multidetector CT imaging of the abdomen and pelvis was performed using the standard protocol following bolus administration of intravenous contrast. RADIATION DOSE REDUCTION: This exam was performed according to the departmental dose-optimization program which includes automated exposure control, adjustment of the mA and/or kV according to patient size and/or use of iterative reconstruction technique. CONTRAST:  OMNIPAQUE IOHEXOL 300 MG/ML  SOLN COMPARISON:  Recent CT scan of the abdomen and pelvis 10/14/2022 FINDINGS: Lower chest: No acute abnormality. Hepatobiliary: Mild hypoattenuation in hepatic segment 5 along the periphery of the gallbladder fossa again noted. Findings remain consistent with focal fatty infiltration. No focal liver abnormality is seen. No gallstones, gallbladder wall thickening, or biliary dilatation. Pancreas: Unremarkable. No  pancreatic ductal dilatation or surrounding inflammatory changes. Spleen: Normal in size without focal abnormality. Adrenals/Urinary Tract: Adrenal glands are unremarkable. Kidneys are normal, without renal calculi, focal lesion, or hydronephrosis. Bladder is unremarkable. Stomach/Bowel: Stomach is within normal limits. Appendix appears normal. The terminal ileum appears normal. No evidence of bowel wall thickening, distention, or inflammatory changes. Vascular/Lymphatic: No significant vascular  findings are present. No enlarged abdominal or pelvic lymph nodes. Reproductive: Uterus and bilateral adnexa are unremarkable. Other: No abdominal wall hernia or abnormality. No abdominopelvic ascites. Musculoskeletal: No acute or significant osseous findings. IMPRESSION: 1. No acute abnormality within the abdomen or pelvis. 2. Unchanged appearance of mild focal fatty infiltration along the gallbladder fossa in segment 6 of the liver. Electronically Signed   By: Malachy Moan M.D.   On: 10/19/2022 11:27   PROCEDURES: Critical Care performed: No .1-3 Lead EKG Interpretation  Performed by: Merwyn Katos, MD Authorized by: Merwyn Katos, MD     Interpretation: normal     ECG rate:  71   ECG rate assessment: normal     Rhythm: sinus rhythm     Ectopy: none     Conduction: normal    MEDICATIONS ORDERED IN ED: Medications  ketorolac (TORADOL) 30 MG/ML injection 30 mg (30 mg Intravenous Given 10/19/22 0914)  sodium chloride 0.9 % bolus 1,000 mL (0 mLs Intravenous Stopped 10/19/22 1211)  ondansetron (ZOFRAN) injection 4 mg (4 mg Intravenous Given 10/19/22 0914)  fentaNYL (SUBLIMAZE) injection 25 mcg (25 mcg Intravenous Given 10/19/22 1013)  iohexol (OMNIPAQUE) 300 MG/ML solution 100 mL (100 mLs Intravenous Contrast Given 10/19/22 1018)   IMPRESSION / MDM / ASSESSMENT AND PLAN / ED COURSE  I reviewed the triage vital signs and the nursing notes.                             The patient is on the cardiac monitor to evaluate for evidence of arrhythmia and/or significant heart rate changes. Patient's presentation is most consistent with acute presentation with potential threat to life or bodily function. Patient's symptoms not typical for emergent causes of abdominal pain such as, but not limited to, appendicitis, abdominal aortic aneurysm, surgical biliary disease, pancreatitis, SBO, mesenteric ischemia, serious intra-abdominal bacterial illness. Presentation also not typical of gynecologic  emergencies such as TOA, Ovarian Torsion, PID. Not Ectopic.  May be possible element of endometriosis given her history Doubt atypical ACS.  Pt tolerating PO. Disposition: Patient will be discharged with strict return precautions and follow up with primary MD within 12-24 hours for further evaluation. Patient understands that this still may have an early presentation of an emergent medical condition such as appendicitis that will require a recheck.   FINAL CLINICAL IMPRESSION(S) / ED DIAGNOSES   Final diagnoses:  Right lower quadrant abdominal pain   Rx / DC Orders   ED Discharge Orders          Ordered    meloxicam (MOBIC) 15 MG tablet  Daily        10/19/22 1205    cyclobenzaprine (FLEXERIL) 10 MG tablet  3 times daily PRN        10/19/22 1205    HYDROcodone-acetaminophen (NORCO) 5-325 MG tablet  Every 4 hours PRN        10/19/22 1205           Note:  This document was prepared using Dragon voice recognition software and may include unintentional dictation errors.  Merwyn Katos, MD 10/19/22 [id number]

## 2022-11-15 ENCOUNTER — Ambulatory Visit: Payer: Medicaid Other | Admitting: Family Medicine

## 2022-11-29 ENCOUNTER — Ambulatory Visit: Payer: Medicaid Other | Admitting: Family Medicine

## 2023-01-24 ENCOUNTER — Ambulatory Visit: Payer: Self-pay | Admitting: Nurse Practitioner

## 2023-03-29 ENCOUNTER — Ambulatory Visit: Payer: Self-pay | Admitting: Nurse Practitioner

## 2023-04-12 ENCOUNTER — Ambulatory Visit (INDEPENDENT_AMBULATORY_CARE_PROVIDER_SITE_OTHER): Payer: Self-pay | Admitting: Family Medicine

## 2023-04-12 ENCOUNTER — Encounter: Payer: Self-pay | Admitting: Family Medicine

## 2023-04-12 VITALS — BP 117/83 | HR 85 | Ht 64.0 in | Wt 204.0 lb

## 2023-04-12 DIAGNOSIS — Z7689 Persons encountering health services in other specified circumstances: Secondary | ICD-10-CM

## 2023-04-12 DIAGNOSIS — G500 Trigeminal neuralgia: Secondary | ICD-10-CM

## 2023-04-12 DIAGNOSIS — F0781 Postconcussional syndrome: Secondary | ICD-10-CM | POA: Insufficient documentation

## 2023-04-12 DIAGNOSIS — N809 Endometriosis, unspecified: Secondary | ICD-10-CM | POA: Insufficient documentation

## 2023-04-12 DIAGNOSIS — G43909 Migraine, unspecified, not intractable, without status migrainosus: Secondary | ICD-10-CM | POA: Insufficient documentation

## 2023-04-12 DIAGNOSIS — D649 Anemia, unspecified: Secondary | ICD-10-CM | POA: Insufficient documentation

## 2023-04-12 DIAGNOSIS — M8580 Other specified disorders of bone density and structure, unspecified site: Secondary | ICD-10-CM | POA: Insufficient documentation

## 2023-04-12 DIAGNOSIS — N83209 Unspecified ovarian cyst, unspecified side: Secondary | ICD-10-CM | POA: Insufficient documentation

## 2023-04-12 MED ORDER — CYCLOBENZAPRINE HCL 10 MG PO TABS: 10.0000 mg | ORAL_TABLET | Freq: Three times a day (TID) | ORAL | 0 refills | Status: AC | PRN

## 2023-04-12 MED ORDER — TOPIRAMATE 25 MG PO CPSP: 25.0000 mg | ORAL_CAPSULE | Freq: Two times a day (BID) | ORAL | 2 refills | Status: AC
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
## 2023-04-12 NOTE — Patient Instructions (Signed)

## 2023-05-17 ENCOUNTER — Encounter (HOSPITAL_COMMUNITY): Payer: Self-pay

## 2023-05-17 ENCOUNTER — Ambulatory Visit: Payer: Self-pay

## 2023-05-24 ENCOUNTER — Encounter: Payer: Self-pay | Admitting: Family Medicine

## 2023-07-17 ENCOUNTER — Encounter: Payer: Self-pay | Admitting: Family Medicine

## 2023-09-12 ENCOUNTER — Encounter: Payer: Self-pay | Admitting: Family Medicine

## 2023-11-21 ENCOUNTER — Encounter: Payer: Self-pay | Admitting: Family Medicine

## 2024-02-12 ENCOUNTER — Encounter: Payer: Self-pay | Admitting: Family Medicine

## 2024-03-04 ENCOUNTER — Encounter: Payer: Self-pay | Admitting: Family Medicine
